# Patient Record
Sex: Male | Born: 2000 | Race: White | Hispanic: No | Marital: Married | Smoking: Never smoker
Health system: Southern US, Community
[De-identification: ages and names within clinical notes are randomized; demographics above are authoritative.]

---
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
## 2021-01-12 ENCOUNTER — Ambulatory Visit: Payer: Medicaid Other | Admitting: Nurse Practitioner

## 2021-01-12 ENCOUNTER — Ambulatory Visit (INDEPENDENT_AMBULATORY_CARE_PROVIDER_SITE_OTHER): Payer: Medicaid Other

## 2021-01-12 ENCOUNTER — Other Ambulatory Visit: Payer: Medicaid Other

## 2021-01-12 ENCOUNTER — Ambulatory Visit (INDEPENDENT_AMBULATORY_CARE_PROVIDER_SITE_OTHER): Payer: Medicaid Other | Admitting: Nurse Practitioner

## 2021-01-12 ENCOUNTER — Encounter: Payer: Self-pay | Admitting: Nurse Practitioner

## 2021-01-12 ENCOUNTER — Encounter: Payer: Self-pay | Admitting: Family Medicine

## 2021-01-12 DIAGNOSIS — R059 Cough, unspecified: Secondary | ICD-10-CM

## 2021-01-12 DIAGNOSIS — R509 Fever, unspecified: Secondary | ICD-10-CM

## 2021-01-12 LAB — VERITOR FLU A/B WAIVED
Influenza A: NEGATIVE
Influenza B: NEGATIVE

## 2021-01-12 MED ORDER — ACETAMINOPHEN 500 MG PO TABS: 500.0000 mg | ORAL_TABLET | Freq: Four times a day (QID) | ORAL | 0 refills | Status: DC | PRN

## 2021-01-12 MED ORDER — CETIRIZINE HCL 10 MG PO TABS: 10.0000 mg | ORAL_TABLET | Freq: Every day | ORAL | 1 refills | Status: DC

## 2021-01-12 MED ORDER — DM-GUAIFENESIN ER 30-600 MG PO TB12: 1.0000 | ORAL_TABLET | Freq: Two times a day (BID) | ORAL | 0 refills | Status: DC

## 2021-01-12 MED ORDER — PREDNISONE 10 MG (21) PO TBPK: ORAL_TABLET | ORAL | 0 refills | Status: DC

## 2021-01-12 NOTE — Assessment & Plan Note (Signed)
Worsening symptoms of cough and fever in the last 24 to 48 hours.  Patient is reporting fever of 101.2, cough, head congestion, pain in his chest when inhaling and exhaling.  Denies sore throat, nausea or vomiting.  Chest x-ray ordered to rule out pneumonia, guaifenesin for cough and congestion, flu swab, and COVID-19 swab.  Increase hydration, Tylenol and ibuprofen for pain and fever, prednisone taper, Zyrtec 10 mg tablet daily,  Education provided to patient.  Rx sent to pharmacy follow-up with worsening unresolved symptoms.

## 2021-01-12 NOTE — Progress Notes (Signed)
   Virtual Visit  Note Due to COVID-19 pandemic this visit was conducted virtually. This visit type was conducted due to national recommendations for restrictions regarding the COVID-19 Pandemic (e.g. social distancing, sheltering in place) in an effort to limit this patient's exposure and mitigate transmission in our community. All issues noted in this document were discussed and addressed.  A physical exam was not performed with this format.  I connected with Troy Collins on 01/12/21 at  11 AM by telephone and verified that I am speaking with the correct person using two identifiers. Troy Collins is currently located at home and mother is with patient during visit. The provider, Daryll Drown, NP is located in their office at time of visit.  I discussed the limitations, risks, security and privacy concerns of performing an evaluation and management service by telephone and the availability of in person appointments. I also discussed with the patient that there may be a patient responsible charge related to this service. The patient expressed understanding and agreed to proceed.   History and Present Illness:  HPI    Review of Systems  Constitutional: Positive for chills, fever and malaise/fatigue.  HENT: Positive for ear pain.   Respiratory: Positive for cough.   Skin: Negative for rash.  All other systems reviewed and are negative.    Observations/Objective: Televisit patient did not sound to be in distress  Assessment and Plan: Cough with fever Worsening symptoms of cough and fever in the last 24 to 48 hours.  Patient is reporting fever of 101.2, cough, head congestion, pain in his chest when inhaling and exhaling.  Denies sore throat, nausea or vomiting.  Chest x-ray ordered to rule out pneumonia, guaifenesin for cough and congestion, flu swab, and COVID-19 swab.  Increase hydration, Tylenol and ibuprofen for pain and fever, prednisone taper, Zyrtec 10 mg tablet  daily,  Education provided to patient.  Rx sent to pharmacy follow-up with worsening unresolved symptoms.   Follow Up Instructions: Follow-up for worsening unresolved symptoms.    I discussed the assessment and treatment plan with the patient. The patient was provided an opportunity to ask questions and all were answered. The patient agreed with the plan and demonstrated an understanding of the instructions.   The patient was advised to call back or seek an in-person evaluation if the symptoms worsen or if the condition fails to improve as anticipated.  The above assessment and management plan was discussed with the patient. The patient verbalized understanding of and has agreed to the management plan. Patient is aware to call the clinic if symptoms persist or worsen. Patient is aware when to return to the clinic for a follow-up visit. Patient educated on when it is appropriate to go to the emergency department.   Time call ended: 11:11 a.m.  I provided 11 minutes of  non face-to-face time during this encounter.    Daryll Drown, NP

## 2021-01-12 NOTE — Patient Instructions (Signed)
Fever, Adult     A fever is an increase in your body's temperature. It often means a temperature of 100.62F (38C) or higher. Brief mild or moderate fevers often have no long-term effects. They often do not need treatment. Moderate or high fevers may make you feel uncomfortable. Sometimes, they can be a sign of a serious illness or disease. A fever that keeps coming back or that lasts a long time may cause you to lose water in your body (get dehydrated). You can take your temperature with a thermometer to see if you have a fever. Temperature can change with:  Age.  Time of day.  Where the thermometer is put in the body. Readings may vary when the thermometer is put: ? In the mouth (oral). ? In the butt (rectal). ? In the ear (tympanic). ? Under the arm (axillary). ? On the forehead (temporal). Follow these instructions at home: Medicines  Take over-the-counter and prescription medicines only as told by your doctor. Follow the dosing instructions carefully.  If you were prescribed an antibiotic medicine, take it as told by your doctor. Do not stop taking it even if you start to feel better. General instructions  Watch for any changes in your symptoms. Tell your doctor about them.  Rest as needed.  Drink enough fluid to keep your pee (urine) pale yellow.  Sponge yourself or bathe with room-temperature water as needed. This helps to lower your body temperature. Do not use ice water.  Do not use too many blankets or wear clothes that are too heavy.  If your fever was caused by an infection that spreads from person to person (is contagious), such as a cold or the flu: ? You should stay home from work and public places for at least 24 hours after your fever is gone. ? Your fever should be gone for at least 24 hours without the need to use medicines. Contact a doctor if:  You throw up (vomit).  You cannot eat or drink without throwing up.  You have watery poop (diarrhea).  It  hurts when you pee.  Your symptoms do not get better with treatment.  You have new symptoms.  You feel very weak. Get help right away if:  You are short of breath or have trouble breathing.  You are dizzy or you pass out (faint).  You feel mixed up (confused).  You have signs of not having enough water in your body, such as: ? Dark pee, very little pee, or no pee. ? Cracked lips. ? Dry mouth. ? Sunken eyes. ? Sleepiness. ? Weakness.  You have very bad pain in your belly (abdomen).  You keep throwing up or having watery poop.  You have a rash on your skin.  Your symptoms get worse all of a sudden. Summary  A fever is an increase in your body's temperature. It often means a temperature of 100.62F (38C) or higher.  Watch for any changes in your symptoms. Tell your doctor about them.  Take all medicines only as told by your doctor.  Do not go to work or other public places if your fever was caused by an illness that can spread to other people.  Get help right away if you have signs that you do not have enough water in your body. This information is not intended to replace advice given to you by your health care provider. Make sure you discuss any questions you have with your health care provider. Document Revised: 01/07/2018  Document Reviewed: 01/07/2018 Elsevier Patient Education  2021 Elsevier Inc. Cough, Adult A cough helps to clear your throat and lungs. A cough may be a sign of an illness or another medical condition. An acute cough may only last 2-3 weeks, while a chronic cough may last 8 or more weeks. Many things can cause a cough. They include:  Germs (viruses or bacteria) that attack the airway.  Breathing in things that bother (irritate) your lungs.  Allergies.  Asthma.  Mucus that runs down the back of your throat (postnasal drip).  Smoking.  Acid backing up from the stomach into the tube that moves food from the mouth to the stomach  (gastroesophageal reflux).  Some medicines.  Lung problems.  Other medical conditions, such as heart failure or a blood clot in the lung (pulmonary embolism). Follow these instructions at home: Medicines  Take over-the-counter and prescription medicines only as told by your doctor.  Talk with your doctor before you take medicines that stop a cough (cough suppressants). Lifestyle  Do not smoke, and try not to be around smoke. Do not use any products that contain nicotine or tobacco, such as cigarettes, e-cigarettes, and chewing tobacco. If you need help quitting, ask your doctor.  Drink enough fluid to keep your pee (urine) pale yellow.  Avoid caffeine.  Do not drink alcohol if your doctor tells you not to drink.   General instructions  Watch for any changes in your cough. Tell your doctor about them.  Always cover your mouth when you cough.  Stay away from things that make you cough, such as perfume, candles, campfire smoke, or cleaning products.  If the air is dry, use a cool mist vaporizer or humidifier in your home.  If your cough is worse at night, try using extra pillows to raise your head up higher while you sleep.  Rest as needed.  Keep all follow-up visits as told by your doctor. This is important.   Contact a doctor if:  You have new symptoms.  You cough up pus.  Your cough does not get better after 2-3 weeks, or your cough gets worse.  Cough medicine does not help your cough and you are not sleeping well.  You have pain that gets worse or pain that is not helped with medicine.  You have a fever.  You are losing weight and you do not know why.  You have night sweats. Get help right away if:  You cough up blood.  You have trouble breathing.  Your heartbeat is very fast. These symptoms may be an emergency. Do not wait to see if the symptoms will go away. Get medical help right away. Call your local emergency services (911 in the [place] Do not drive  yourself to the hospital. Summary  A cough helps to clear your throat and lungs. Many things can cause a cough.  Take over-the-counter and prescription medicines only as told by your doctor.  Always cover your mouth when you cough.  Contact a doctor if you have new symptoms or you have a cough that does not get better or gets worse. This information is not intended to replace advice given to you by your health care provider. Make sure you discuss any questions you have with your health care provider. Document Revised: 09/12/2019 Document Reviewed: 08/12/2018 Elsevier Patient Education  2021 [employer].

## 2021-01-13 LAB — NOVEL CORONAVIRUS, NAA: SARS-CoV-2, NAA: NOT DETECTED

## 2021-01-13 LAB — SARS-COV-2, NAA 2 DAY TAT

## 2021-01-14 ENCOUNTER — Other Ambulatory Visit: Payer: Self-pay | Admitting: Nurse Practitioner

## 2021-01-14 MED ORDER — AZITHROMYCIN 250 MG PO TABS: ORAL_TABLET | ORAL | 0 refills | Status: AC

## 2021-01-14 NOTE — Progress Notes (Signed)
Mother aware

## 2021-01-19 ENCOUNTER — Encounter: Payer: Self-pay | Admitting: Family Medicine

## 2021-01-20 NOTE — Telephone Encounter (Signed)
Seen Je 06/08. She is off please advise.

## 2021-01-21 NOTE — Telephone Encounter (Signed)
I recommend that he eat frequent small meals that are rich in protein.  The body is healing and it may take a little time before he is back to his normal self.  Recommend recheck in 2 weeks.  If appetite is not improving at that time, we can talk about some options.

## 2021-02-04 ENCOUNTER — Other Ambulatory Visit: Payer: Self-pay

## 2021-02-04 DIAGNOSIS — R059 Cough, unspecified: Secondary | ICD-10-CM

## 2021-02-04 MED ORDER — CETIRIZINE HCL 10 MG PO TABS: 10.0000 mg | ORAL_TABLET | Freq: Every day | ORAL | 5 refills | Status: DC

## 2021-03-12 ENCOUNTER — Emergency Department (HOSPITAL_COMMUNITY)
Admission: EM | Admit: 2021-03-12 | Discharge: 2021-03-13 | Disposition: A | Discharge status: Home / Self Care | Payer: Medicaid Other | Attending: Emergency Medicine | Admitting: Emergency Medicine

## 2021-03-12 ENCOUNTER — Other Ambulatory Visit: Payer: Self-pay

## 2021-03-12 DIAGNOSIS — R109 Unspecified abdominal pain: Secondary | ICD-10-CM | POA: Insufficient documentation

## 2021-03-12 DIAGNOSIS — R519 Headache, unspecified: Secondary | ICD-10-CM | POA: Insufficient documentation

## 2021-03-12 DIAGNOSIS — Y92410 Unspecified street and highway as the place of occurrence of the external cause: Secondary | ICD-10-CM | POA: Insufficient documentation

## 2021-03-12 DIAGNOSIS — S2220XA Unspecified fracture of sternum, initial encounter for closed fracture: Secondary | ICD-10-CM | POA: Diagnosis not present

## 2021-03-12 DIAGNOSIS — M25562 Pain in left knee: Secondary | ICD-10-CM | POA: Diagnosis not present

## 2021-03-12 DIAGNOSIS — R079 Chest pain, unspecified: Secondary | ICD-10-CM | POA: Diagnosis not present

## 2021-03-12 DIAGNOSIS — S2222XA Fracture of body of sternum, initial encounter for closed fracture: Secondary | ICD-10-CM | POA: Diagnosis not present

## 2021-03-12 DIAGNOSIS — R9431 Abnormal electrocardiogram [ECG] [EKG]: Secondary | ICD-10-CM | POA: Diagnosis not present

## 2021-03-12 DIAGNOSIS — S199XXA Unspecified injury of neck, initial encounter: Secondary | ICD-10-CM | POA: Diagnosis not present

## 2021-03-12 DIAGNOSIS — S3993XA Unspecified injury of pelvis, initial encounter: Secondary | ICD-10-CM | POA: Diagnosis not present

## 2021-03-12 DIAGNOSIS — S3991XA Unspecified injury of abdomen, initial encounter: Secondary | ICD-10-CM | POA: Diagnosis not present

## 2021-03-12 DIAGNOSIS — M25521 Pain in right elbow: Secondary | ICD-10-CM | POA: Insufficient documentation

## 2021-03-12 DIAGNOSIS — S299XXA Unspecified injury of thorax, initial encounter: Secondary | ICD-10-CM | POA: Diagnosis present

## 2021-03-13 ENCOUNTER — Other Ambulatory Visit: Payer: Self-pay

## 2021-03-13 ENCOUNTER — Encounter (HOSPITAL_COMMUNITY): Payer: Self-pay

## 2021-03-13 ENCOUNTER — Emergency Department (HOSPITAL_COMMUNITY): Payer: Medicaid Other

## 2021-03-13 ENCOUNTER — Encounter: Payer: Self-pay | Admitting: Family Medicine

## 2021-03-13 ENCOUNTER — Telehealth (HOSPITAL_COMMUNITY): Payer: Self-pay | Admitting: Emergency Medicine

## 2021-03-13 DIAGNOSIS — M25521 Pain in right elbow: Secondary | ICD-10-CM | POA: Diagnosis not present

## 2021-03-13 DIAGNOSIS — R519 Headache, unspecified: Secondary | ICD-10-CM | POA: Diagnosis not present

## 2021-03-13 DIAGNOSIS — S3991XA Unspecified injury of abdomen, initial encounter: Secondary | ICD-10-CM | POA: Diagnosis not present

## 2021-03-13 DIAGNOSIS — S3993XA Unspecified injury of pelvis, initial encounter: Secondary | ICD-10-CM | POA: Diagnosis not present

## 2021-03-13 DIAGNOSIS — R079 Chest pain, unspecified: Secondary | ICD-10-CM | POA: Diagnosis not present

## 2021-03-13 DIAGNOSIS — M25562 Pain in left knee: Secondary | ICD-10-CM | POA: Diagnosis not present

## 2021-03-13 DIAGNOSIS — S199XXA Unspecified injury of neck, initial encounter: Secondary | ICD-10-CM | POA: Diagnosis not present

## 2021-03-13 LAB — CBC WITH DIFFERENTIAL/PLATELET
Abs Immature Granulocytes: 0.09 10*3/uL — ABNORMAL HIGH (ref 0.00–0.07)
Basophils Absolute: 0.1 10*3/uL (ref 0.0–0.1)
Basophils Relative: 1 %
Eosinophils Absolute: 0.1 10*3/uL (ref 0.0–0.5)
Eosinophils Relative: 1 %
HCT: 42.7 % (ref 39.0–52.0)
Hemoglobin: 14 g/dL (ref 13.0–17.0)
Immature Granulocytes: 1 %
Lymphocytes Relative: 13 %
Lymphs Abs: 1.7 10*3/uL (ref 0.7–4.0)
MCH: 29.9 pg (ref 26.0–34.0)
MCHC: 32.8 g/dL (ref 30.0–36.0)
MCV: 91.2 fL (ref 80.0–100.0)
Monocytes Absolute: 1.1 10*3/uL — ABNORMAL HIGH (ref 0.1–1.0)
Monocytes Relative: 9 %
Neutro Abs: 10.1 10*3/uL — ABNORMAL HIGH (ref 1.7–7.7)
Neutrophils Relative %: 75 %
Platelets: 296 10*3/uL (ref 150–400)
RBC: 4.68 MIL/uL (ref 4.22–5.81)
RDW: 13.2 % (ref 11.5–15.5)
WBC: 13.1 10*3/uL — ABNORMAL HIGH (ref 4.0–10.5)
nRBC: 0 % (ref 0.0–0.2)

## 2021-03-13 LAB — BASIC METABOLIC PANEL
Anion gap: 7 (ref 5–15)
BUN: 8 mg/dL (ref 6–20)
CO2: 26 mmol/L (ref 22–32)
Calcium: 8.7 mg/dL — ABNORMAL LOW (ref 8.9–10.3)
Chloride: 105 mmol/L (ref 98–111)
Creatinine, Ser: 0.81 mg/dL (ref 0.61–1.24)
GFR, Estimated: >60 mL/min (ref >60)
Glucose, Bld: 103 mg/dL — ABNORMAL HIGH (ref 70–99)
Potassium: 3.4 mmol/L — ABNORMAL LOW (ref 3.5–5.1)
Sodium: 138 mmol/L (ref 135–145)

## 2021-03-13 LAB — TROPONIN I (HIGH SENSITIVITY): Troponin I (High Sensitivity): 4 ng/L (ref <18)

## 2021-03-13 MED ORDER — IOHEXOL 300 MG/ML  SOLN
100.0000 mL | Freq: Once | INTRAMUSCULAR | Status: AC | PRN
  Administered 2021-03-13: 100 mL via INTRAVENOUS

## 2021-03-13 MED ORDER — IBUPROFEN 400 MG PO TABS
400.0000 mg | ORAL_TABLET | Freq: Once | ORAL | Status: AC
  Administered 2021-03-13: 400 mg via ORAL

## 2021-03-13 MED ORDER — HYDROCODONE-ACETAMINOPHEN 5-325 MG PO TABS: 2.0000 | ORAL_TABLET | ORAL | 0 refills | Status: DC | PRN

## 2021-03-13 MED ORDER — HYDROCODONE-ACETAMINOPHEN 5-325 MG PO TABS: 1.0000 | ORAL_TABLET | Freq: Four times a day (QID) | ORAL | 0 refills | Status: DC | PRN

## 2021-03-13 MED ORDER — HYDROCODONE-ACETAMINOPHEN 5-325 MG PO TABS
1.0000 | ORAL_TABLET | Freq: Once | ORAL | Status: AC
  Administered 2021-03-13: 1 via ORAL
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
## 2021-03-13 NOTE — Discharge Instructions (Addendum)
Take the pain medication as prescribed.  Avoid lifting for least 1 week.  Follow-up with your doctor for recheck next week.  Return to the ED with worsening pain, difficulty breathing, any other concerns.

## 2021-03-13 NOTE — Telephone Encounter (Signed)
Per discussion with pharmacy the patient's dosing of hydrocodone was too much, I have sent a different prescription with a decreased frequency as this patient is opiate nave, 1 tablet every 6 hours of Norco 5/325 as needed for pain
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
## 2021-03-14 ENCOUNTER — Telehealth: Payer: Self-pay

## 2021-03-14 NOTE — Telephone Encounter (Signed)
Transition Care Management Unsuccessful Follow-up Telephone Call  Date of discharge and from where:  03/13/2021-[facility]   Attempts:  1st Attempt  Reason for unsuccessful TCM follow-up call:  Unable to reach patient

## 2021-03-15 ENCOUNTER — Ambulatory Visit (INDEPENDENT_AMBULATORY_CARE_PROVIDER_SITE_OTHER): Payer: Medicaid Other | Admitting: Family Medicine

## 2021-03-15 ENCOUNTER — Encounter: Payer: Self-pay | Admitting: Family Medicine

## 2021-03-15 ENCOUNTER — Other Ambulatory Visit: Payer: Self-pay

## 2021-03-15 ENCOUNTER — Ambulatory Visit (INDEPENDENT_AMBULATORY_CARE_PROVIDER_SITE_OTHER): Payer: Medicaid Other

## 2021-03-15 VITALS — BP 107/62 | HR 63 | Temp 97.9°F | Ht 74.0 in | Wt 190.6 lb

## 2021-03-15 DIAGNOSIS — S2220XD Unspecified fracture of sternum, subsequent encounter for fracture with routine healing: Secondary | ICD-10-CM | POA: Diagnosis not present

## 2021-03-15 DIAGNOSIS — S2222XA Fracture of body of sternum, initial encounter for closed fracture: Secondary | ICD-10-CM

## 2021-03-15 DIAGNOSIS — S2222XD Fracture of body of sternum, subsequent encounter for fracture with routine healing: Secondary | ICD-10-CM | POA: Diagnosis not present

## 2021-03-15 MED ORDER — METHOCARBAMOL 500 MG PO TABS: 500.0000 mg | ORAL_TABLET | Freq: Three times a day (TID) | ORAL | 0 refills | Status: DC | PRN

## 2021-03-15 MED ORDER — MELOXICAM 15 MG PO TABS: 15.0000 mg | ORAL_TABLET | Freq: Every day | ORAL | 0 refills | Status: DC | PRN
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
## 2021-03-15 NOTE — Telephone Encounter (Signed)
Transition Care Management Unsuccessful Follow-up Telephone Call  Date of discharge and from where:  03/13/2021-[facility] ED  Attempts:  2nd Attempt  Reason for unsuccessful TCM follow-up call:  Unable to reach patient

## 2021-03-16 NOTE — Telephone Encounter (Signed)
Transition Care Management Unsuccessful Follow-up Telephone Call  Date of discharge and from where:  03/13/2021 [facility]  Attempts:  3rd Attempt  Reason for unsuccessful TCM follow-up call:  Unable to reach patient

## 2021-03-24 ENCOUNTER — Encounter: Payer: Self-pay | Admitting: Family Medicine

## 2021-03-25 ENCOUNTER — Ambulatory Visit (INDEPENDENT_AMBULATORY_CARE_PROVIDER_SITE_OTHER): Payer: Medicaid Other | Admitting: Family Medicine

## 2021-03-25 ENCOUNTER — Other Ambulatory Visit: Payer: Self-pay

## 2021-03-25 ENCOUNTER — Encounter: Payer: Self-pay | Admitting: Family Medicine

## 2021-03-25 DIAGNOSIS — S2222XD Fracture of body of sternum, subsequent encounter for fracture with routine healing: Secondary | ICD-10-CM | POA: Diagnosis not present

## 2021-03-25 MED ORDER — LIDOCAINE 5 % EX OINT: 1.0000 "application " | TOPICAL_OINTMENT | CUTANEOUS | 0 refills | Status: DC | PRN

## 2021-03-25 MED ORDER — METHOCARBAMOL 500 MG PO TABS: 500.0000 mg | ORAL_TABLET | Freq: Three times a day (TID) | ORAL | 1 refills | Status: DC | PRN
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
## 2021-03-25 NOTE — Patient Instructions (Signed)
Sternal Fracture  A sternal fracture is a break in the bone in the center of the chest (sternum or breastbone). This type of fracture often causes pain that can get worse when you breathe deeply or cough. A sternal fracture is not dangerous unless there is also aninjury to your heart or lungs, which are protected by the sternum and ribs. What are the causes? This condition is usually caused by a forceful injury from: Motor vehicle accidents. This is the most common cause. Contact sports. Physical assaults. Falls. You can also develop a sternal fracture without having a forceful injury if the bone becomes weakened over time (stress fracture or insufficiency fracture). What increases the risk? You are more likely to have a sternal fracture if you: Participate in contact sports, such as football, lacrosse, wrestling, or martial arts. Work at elevated heights, such as in Holiday representative. This increases your risk of a fall. The following factors may make you more likely to develop a stress fracture or insufficiency fracture: Being male. Being a postmenopausal woman. Being 50 years of age or older. Having weak bones (osteoporosis). Having severe curvature of the spine. Being on long-term steroid treatment. What are the signs or symptoms? Symptoms of this condition include: Pain over the sternum or chest wall. Tenderness of the sternum or chest wall. Pain that gets worse when you breathe deeply or cough. Shortness of breath. A bruise (contusion) over the chest. Swelling. A crackling sound when taking a deep breath or pressing on the sternum. How is this diagnosed? This condition is diagnosed based on: A physical exam. Your medical history. Tests, such as: Blood oxygen level. This is measured with a pulse oximetry test. Repeated electrocardiograms (ECGs). This is to make sure that your heart is not injured. A blood test. This is to check for damage to your heart muscle. Imaging tests,  such as: A CT scan. An ultrasound. Chest X-rays. How is this treated? Treatment depends on the severity of your injury. A sternal fracture without any other injury (isolated sternal fracture) usually heals without treatment. You may need to: Limit some activities at home. Take medicines for pain relief. Do deep breathing exercises to prevent injury and infection to your lungs. In rare cases, surgery may be needed if a sternal fracture: Continues to cause severe pain. Causes shortness of breath or respiratory problems. Involves bones that have been moved too far out of position (displaced fracture). Follow these instructions at home: Managing pain, stiffness, and swelling  If directed, put ice on the injured area. Put ice in a plastic bag. Place a towel between your skin and the bag. Leave the ice on for 20 minutes, 2-3 times a day.  Medicines Take over-the-counter and prescription medicines only as told by your health care provider. Ask your health care provider if the medicine prescribed to you: Requires you to avoid driving or using heavy machinery. Can cause constipation. You may need to take actions to prevent or treat constipation, such as: Drink enough fluid to keep your urine pale yellow. Take over-the-counter or prescription medicines. Eat foods that are high in fiber, such as beans, whole grains, and fresh fruits and vegetables. Limit foods that are high in fat and processed sugars, such as fried or sweet foods. Activity Rest at home. Return to your normal activities as told by your health care provider. Ask your health care provider what activities are safe for you. Do breathing exercises as told by your health care provider. Do not push or pull  with your arms when getting in and out of bed. Do not lift anything that is heavier than 10 lb (4.5 kg), or the limit that you are told, until your health care provider says that it is safe. General instructions Hug a pillow  when you sneeze, cough, or twist or bend at the waist. Doing this helps support your chest. Do not use any products that contain nicotine or tobacco, such as cigarettes, e-cigarettes, and chewing tobacco. These can delay bone healing. If you need help quitting, ask your health care provider. Keep all follow-up visits as told by your health care provider. This is important. Contact a health care provider if: Your pain medicine is not helping. You continue to have pain after several weeks. You have swelling or bruising that gets worse. You develop a fever or chills. You develop a cough and you cough up thick or bloody mucus from your lungs (sputum). Get help right away if you: Have difficulty breathing. Have chest pain. Feel light-headed. Have fast or irregular heartbeats (palpitations). Feel nauseous or have pain in your abdomen. Summary A sternal fracture is a break in the bone in the center of the chest (sternum or breastbone). This condition is usually caused by a forceful injury. The most common cause is motor vehicle accidents. If directed, put ice on the injured area. Return to your normal activities as told by your health care provider. Ask your health care provider what activities are safe for you. This information is not intended to replace advice given to you by your health care provider. Make sure you discuss any questions you have with your healthcare provider. Document Revised: 07/25/2018 Document Reviewed: 07/25/2018 Elsevier Patient Education  2022 [employer].

## 2021-03-25 NOTE — Telephone Encounter (Signed)
Please offer him a same day appt with whoever is available for a recheck.

## 2021-04-07 ENCOUNTER — Other Ambulatory Visit: Payer: Self-pay | Admitting: Family

## 2021-04-07 ENCOUNTER — Encounter: Payer: Self-pay | Admitting: Family Medicine

## 2021-04-07 DIAGNOSIS — S2222XD Fracture of body of sternum, subsequent encounter for fracture with routine healing: Secondary | ICD-10-CM

## 2021-04-07 MED ORDER — METHOCARBAMOL 500 MG PO TABS: 500.0000 mg | ORAL_TABLET | Freq: Three times a day (TID) | ORAL | 1 refills | Status: DC | PRN

## 2021-04-10 ENCOUNTER — Other Ambulatory Visit: Payer: Self-pay | Admitting: Family Medicine

## 2021-04-10 DIAGNOSIS — S2222XD Fracture of body of sternum, subsequent encounter for fracture with routine healing: Secondary | ICD-10-CM

## 2021-04-12 ENCOUNTER — Encounter: Payer: Self-pay | Admitting: Family Medicine

## 2021-04-12 NOTE — Telephone Encounter (Signed)
Yes ok to provide.

## 2021-04-18 ENCOUNTER — Telehealth: Payer: Self-pay | Admitting: Family Medicine

## 2021-05-15 ENCOUNTER — Other Ambulatory Visit: Payer: Self-pay | Admitting: Family Medicine

## 2021-05-15 DIAGNOSIS — S2222XD Fracture of body of sternum, subsequent encounter for fracture with routine healing: Secondary | ICD-10-CM

## 2021-10-14 ENCOUNTER — Encounter: Payer: Self-pay | Admitting: Family Medicine

## 2021-10-14 ENCOUNTER — Ambulatory Visit (INDEPENDENT_AMBULATORY_CARE_PROVIDER_SITE_OTHER): Payer: Medicaid Other

## 2021-10-14 ENCOUNTER — Ambulatory Visit (INDEPENDENT_AMBULATORY_CARE_PROVIDER_SITE_OTHER): Payer: Medicaid Other | Admitting: Family Medicine

## 2021-10-14 VITALS — BP 135/78 | HR 94 | Temp 98.3°F | Ht 74.07 in | Wt 175.8 lb

## 2021-10-14 DIAGNOSIS — R0781 Pleurodynia: Secondary | ICD-10-CM | POA: Diagnosis not present

## 2021-10-14 DIAGNOSIS — R0789 Other chest pain: Secondary | ICD-10-CM

## 2021-10-14 MED ORDER — DICLOFENAC SODIUM 75 MG PO TBEC
75.0000 mg | DELAYED_RELEASE_TABLET | Freq: Two times a day (BID) | ORAL | 1 refills | Status: DC
Start: 2021-10-14 — End: 2021-10-19
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
## 2021-10-17 ENCOUNTER — Telehealth: Payer: Self-pay

## 2021-10-17 ENCOUNTER — Other Ambulatory Visit: Payer: Self-pay | Admitting: Family Medicine

## 2021-10-17 DIAGNOSIS — R0789 Other chest pain: Secondary | ICD-10-CM

## 2021-10-17 DIAGNOSIS — R0781 Pleurodynia: Secondary | ICD-10-CM

## 2021-10-17 DIAGNOSIS — R9389 Abnormal findings on diagnostic imaging of other specified body structures: Secondary | ICD-10-CM

## 2021-10-17 NOTE — Telephone Encounter (Signed)
Diclofenac Sodium 75MG  dr tablets ? ?Key: - PA Case ID: [id number] - Rx #: [id number] ? ?Sent to plan ?

## 2021-10-18 ENCOUNTER — Encounter: Payer: Self-pay | Admitting: Family Medicine

## 2021-10-19 MED ORDER — NAPROXEN 500 MG PO TABS: 500.0000 mg | ORAL_TABLET | Freq: Two times a day (BID) | ORAL | 1 refills | Status: DC

## 2021-10-19 NOTE — Telephone Encounter (Signed)
Deniedon March 13 ?PA Case: [id number], Status: Denied. Notification: Completed. ? ? ?Will have to try and fail 2 of the following:  ? ?Health Plan?s Preferred Products ?NAPROXEN TAB 500MG  ?IBUPROFEN TAB 600MG  [id number] ?MELOXICAM TAB 7.5MG  ?EC-NAPROXEN TAB 500MG  [id number] ?NAPROXEN DR TAB 500MG  [id number] ? ?

## 2021-10-19 NOTE — Telephone Encounter (Signed)
Mom aware per dpr.  ?

## 2021-10-19 NOTE — Telephone Encounter (Signed)
Changed to naproxen

## 2021-10-19 NOTE — Addendum Note (Signed)
Addended by: Deliah Boston F on: 10/19/2021 02:02 PM ? ? Modules accepted: Orders ? ?

## 2021-10-19 NOTE — Telephone Encounter (Signed)
Will cc to ordering provider.  I suspect he can use any but maybe chose enteric coating for a reason? ?

## 2021-11-03 ENCOUNTER — Ambulatory Visit: Payer: Medicaid Other | Admitting: Family Medicine

## 2021-11-14 ENCOUNTER — Other Ambulatory Visit (INDEPENDENT_AMBULATORY_CARE_PROVIDER_SITE_OTHER): Payer: Medicaid Other

## 2021-11-14 DIAGNOSIS — R9389 Abnormal findings on diagnostic imaging of other specified body structures: Secondary | ICD-10-CM

## 2021-11-14 DIAGNOSIS — R0789 Other chest pain: Secondary | ICD-10-CM

## 2021-11-14 DIAGNOSIS — R0781 Pleurodynia: Secondary | ICD-10-CM

## 2022-02-23 ENCOUNTER — Ambulatory Visit: Payer: Medicaid Other | Admitting: Family Medicine

## 2022-02-23 ENCOUNTER — Encounter: Payer: Self-pay | Admitting: Family Medicine

## 2022-02-23 VITALS — BP 134/77 | HR 79 | Temp 98.8°F | Ht 74.0 in | Wt 166.0 lb

## 2022-02-23 DIAGNOSIS — K219 Gastro-esophageal reflux disease without esophagitis: Secondary | ICD-10-CM

## 2022-02-23 MED ORDER — OMEPRAZOLE 20 MG PO CPDR: 20.0000 mg | DELAYED_RELEASE_CAPSULE | Freq: Every day | ORAL | 3 refills | Status: DC
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
## 2022-02-27 ENCOUNTER — Ambulatory Visit: Payer: Medicaid Other | Admitting: Nurse Practitioner

## 2022-03-21 ENCOUNTER — Encounter: Payer: Self-pay | Admitting: Nurse Practitioner

## 2022-03-21 ENCOUNTER — Ambulatory Visit (INDEPENDENT_AMBULATORY_CARE_PROVIDER_SITE_OTHER): Payer: Medicaid Other | Admitting: Nurse Practitioner

## 2022-03-21 VITALS — BP 107/73 | HR 87 | Temp 98.6°F | Ht 73.0 in | Wt 162.0 lb

## 2022-03-21 DIAGNOSIS — N62 Hypertrophy of breast: Secondary | ICD-10-CM | POA: Diagnosis not present

## 2022-03-21 MED ORDER — IBUPROFEN 600 MG PO TABS: 600.0000 mg | ORAL_TABLET | Freq: Three times a day (TID) | ORAL | 0 refills | Status: DC | PRN

## 2022-03-21 NOTE — Patient Instructions (Signed)
Gynecomastia, Adult Gynecomastia is an overgrowth of gland tissue in a man's breasts. This may cause one or both breasts to become enlarged. The condition often develops in men who have an imbalance of the male sex hormone (testosterone) and the male sex hormone (estrogen). This means that a man may have too much estrogen, too little testosterone, or both. Gynecomastia may be a normal part of aging for some men. It can also happen to adolescent boys during puberty. What are the causes? This condition may be caused by: Certain medicines, such as: Estrogen supplements and medicines that act like estrogen in the body. Medicines that keep testosterone from functioning normally in the body (testosterone-inhibiting drugs). Anabolic steroids. Medicines to treat heartburn, cancer, heart disease, mental health problems, HIV, or AIDS. Antibiotic medicine. Chemotherapy medicine. Recreational drugs, including alcohol, marijuana, and opioids. Herbal products, including lavender and tea tree oil. A gene that is passed from parent to child (inherited). Certain medical conditions, such as: Tumors in the pituitary or adrenal gland. An overactive thyroid gland. Certain inherited disorders, including a genetic disease that causes low testosterone in males (Klinefelter syndrome). Cancer of the lung, kidney, liver, testicle, or gastrointestinal tract. Conditions that cause liver or kidney failure. Poor nutrition and starvation. Testicle shrinking or failure (testicularatrophy). In some cases, the cause may not be known. What increases the risk? The following factors may make you more likely to develop this condition: Being 50 years old or older. Being overweight. Abusing alcohol or other drugs. Having a family history of gynecomastia. What are the signs or symptoms? In most cases, breast enlargement is the only symptom. The enlargement may start near the nipple, and the breast tissue may feel firm and  rubbery. Other symptoms may include: Pain or tenderness in the breasts. Itchy breasts. How is this diagnosed? This condition may be diagnosed based on: Your symptoms and medical history. A physical exam. Imaging tests, such as: An ultrasound. A mammogram. An MRI. Blood tests. Removal of a sample of breast tissue to be tested in a lab (biopsy). How is this treated? This condition may go away on its own, without treatment. If gynecomastia is caused by a medical problem or drug abuse, treatment may include: Getting treatment for the underlying medical problem or for drug abuse. Changing or stopping medicines. Medicines to block the effects of estrogen. Taking a testosterone replacement. Surgery to remove breast tissue or any lumps in your breasts. Breast reduction surgery. This may be an option if you have severe or painful gynecomastia. Follow these instructions at home:  Take over-the-counter and prescription medicines only as told by your health care provider. Talk to your health care provider before taking any herbal medicines or diet supplements. Do not abuse drugs or alcohol. Keep all follow-up visits as told by your health care provider. This is important. Contact a health care provider if: Your breast tissue grows larger or gets more swollen or painful. You have a lump in your testicle. You have blood or discharge coming from your nipples. Your nipple changes shape. You develop a hard or painful lump in your breast. Summary Gynecomastia is an overgrowth of gland tissue in a man's breasts. This may cause one or both breasts to become enlarged. In most cases, breast enlargement is the only symptom. The enlargement may start near the nipple, and the breast tissue may feel firm and rubbery. This condition may go away on its own, without treatment. In some cases, treatment for an underlying medical problem or for drug   abuse may be needed. Take over-the-counter and prescription  medicines only as told by your health care provider. Do not abuse drugs or alcohol. This information is not intended to replace advice given to you by your health care provider. Make sure you discuss any questions you have with your health care provider. Document Revised: 01/16/2019 Document Reviewed: 01/16/2019 Elsevier Patient Education  2023 Elsevier Inc.  

## 2022-03-21 NOTE — Progress Notes (Signed)
Acute Office Visit  Subjective:     Patient ID: Troy Collins, male    DOB: 12-26-2000, 21 y.o.   MRN: [id number]  Chief Complaint  Patient presents with   Mass    Lump on chest - 2 days ago - staying about the same size - feels a sharp pain off and on     HPI Patient is in today for Pain  He reports new onset left breat pain. was not an injury that may have caused the pain. The pain started a few weeks ago and is worsening. The pain does radiate to mid chest. The pain is described as aching, sharp, and soreness, is 5/10 in intensity, occurring constantly. Symptoms are worse in the: morning, mid-day, afternoon, evening  Aggravating factors: none Relieving factors: none.  He has tried acetaminophen with no relief.    Review of Systems  Constitutional: Negative.  Negative for fever and malaise/fatigue.  HENT: Negative.    Eyes: Negative.   Respiratory: Negative.    Cardiovascular: Negative.   Genitourinary: Negative.   Musculoskeletal:        Left breast pain  Skin: Negative.  Negative for itching and rash.  Neurological: Negative.   All other systems reviewed and are negative.       Objective:    BP 107/73   Pulse 87   Temp 98.6 F (37 C)   Ht 6\' 1"  (1.854 m)   Wt 162 lb (73.5 kg)   SpO2 98%   BMI 21.37 kg/m  BP Readings from Last 3 Encounters:  03/21/22 107/73  02/23/22 134/77  10/14/21 135/78   Wt Readings from Last 3 Encounters:  03/21/22 162 lb (73.5 kg) (60 %, Z= 0.27)*  02/23/22 166 lb (75.3 kg) (66 %, Z= 0.42)*  10/14/21 175 lb 12.8 oz (79.7 kg) (78 %, Z= 0.79)*   * Growth percentiles are based on CDC (Boys, 2-20 Years) data.      Physical Exam Vitals and nursing note reviewed.  Constitutional:      Appearance: Normal appearance.  HENT:     Head: Normocephalic.     Right Ear: External ear normal.     Left Ear: External ear normal.     Nose: Nose normal.     Mouth/Throat:     Mouth: Mucous membranes are moist.     Pharynx: Oropharynx  is clear.  Eyes:     Conjunctiva/sclera: Conjunctivae normal.  Cardiovascular:     Rate and Rhythm: Normal rate and regular rhythm.     Pulses: Normal pulses.     Heart sounds: Normal heart sounds.  Pulmonary:     Effort: Pulmonary effort is normal.     Breath sounds: Normal breath sounds.  Chest:       Comments: Left chest pain no lump palpated Abdominal:     General: Bowel sounds are normal.  Musculoskeletal:        General: Normal range of motion.  Skin:    General: Skin is warm.  Neurological:     General: No focal deficit present.     Mental Status: He is alert and oriented to person, place, and time.  Psychiatric:        Mood and Affect: Mood normal.        Behavior: Behavior normal.     No results found for any visits on 03/21/22.      Assessment & Plan:  Left breast pain symptoms not well controlled in the past few weeks.  Completed bilateral full breast exam.  Differential diagnosis: Fibroadenoma, cyst and gynecomastia.  Patient's symptoms present more like gynecomastia.  Patient does admit to using marijuana which could be sometimes a culprit for breast pain and tenderness in men.  Patient also reports that symptoms have been present for few years but has gotten worse over time. Recommendation-ice compress/warm compress as tolerated.  Ibuprofen 600 mg tablet by mouth as needed for pain.  Ultrasound referral completed results pending.  Follow-up for worsening unresolved symptoms. Problem List Items Addressed This Visit   None Visit Diagnoses     Gynecomastia, male    -  Primary   Relevant Medications   ibuprofen (ADVIL) 600 MG tablet   Other Relevant Orders   US BREAST COMPLETE UNI LEFT INC AXILLA       Meds ordered this encounter  Medications   ibuprofen (ADVIL) 600 MG tablet    Sig: Take 1 tablet (600 mg total) by mouth every 8 (eight) hours as needed.    Dispense:  30 tablet    Refill:  0    Order Specific Question:   Supervising Provider     Answer:   Mechele Claude [phone]    Return if symptoms worsen or fail to improve.  Daryll Drown, NP

## 2022-03-22 ENCOUNTER — Other Ambulatory Visit: Payer: Self-pay

## 2022-03-22 DIAGNOSIS — N6310 Unspecified lump in the right breast, unspecified quadrant: Secondary | ICD-10-CM

## 2022-03-22 DIAGNOSIS — N62 Hypertrophy of breast: Secondary | ICD-10-CM

## 2022-03-27 ENCOUNTER — Telehealth: Payer: Self-pay | Admitting: Family Medicine

## 2022-03-28 NOTE — Telephone Encounter (Signed)
Troy Collins spoke to patient and contact information was given for [facility]. Checked this morning patient is scheduled for 04/12/22

## 2022-04-12 ENCOUNTER — Ambulatory Visit (HOSPITAL_COMMUNITY)
Admission: RE | Admit: 2022-04-12 | Discharge: 2022-04-12 | Disposition: A | Discharge status: Home / Self Care | Payer: Medicaid Other | Source: Ambulatory Visit | Attending: Nurse Practitioner | Admitting: Nurse Practitioner

## 2022-04-12 ENCOUNTER — Encounter (HOSPITAL_COMMUNITY): Payer: Self-pay

## 2022-04-12 DIAGNOSIS — N6310 Unspecified lump in the right breast, unspecified quadrant: Secondary | ICD-10-CM | POA: Diagnosis not present

## 2022-04-12 DIAGNOSIS — N62 Hypertrophy of breast: Secondary | ICD-10-CM | POA: Insufficient documentation

## 2022-04-12 DIAGNOSIS — N644 Mastodynia: Secondary | ICD-10-CM | POA: Diagnosis not present

## 2022-04-18 ENCOUNTER — Ambulatory Visit (INDEPENDENT_AMBULATORY_CARE_PROVIDER_SITE_OTHER): Payer: Medicaid Other | Admitting: Family Medicine

## 2022-04-18 ENCOUNTER — Encounter: Payer: Self-pay | Admitting: Family Medicine

## 2022-04-18 VITALS — BP 124/80 | HR 77 | Temp 98.0°F | Ht 73.01 in | Wt 161.0 lb

## 2022-04-18 DIAGNOSIS — G479 Sleep disorder, unspecified: Secondary | ICD-10-CM | POA: Diagnosis not present

## 2022-04-18 DIAGNOSIS — F419 Anxiety disorder, unspecified: Secondary | ICD-10-CM

## 2022-04-18 DIAGNOSIS — N62 Hypertrophy of breast: Secondary | ICD-10-CM

## 2022-04-18 MED ORDER — SERTRALINE HCL 50 MG PO TABS: 50.0000 mg | ORAL_TABLET | Freq: Every day | ORAL | 1 refills | Status: DC

## 2022-04-18 MED ORDER — HYDROXYZINE PAMOATE 25 MG PO CAPS: 25.0000 mg | ORAL_CAPSULE | Freq: Every evening | ORAL | 1 refills | Status: DC | PRN
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
## 2022-04-18 NOTE — Patient Instructions (Signed)
Gynecomastia, Adult Gynecomastia is an overgrowth of gland tissue in a man's breasts. This may cause one or both breasts to become enlarged. The condition often develops in men who have an imbalance of the male sex hormone (testosterone) and the male sex hormone (estrogen). This means that a man may have too much estrogen, too little testosterone, or both. Gynecomastia may be a normal part of aging for some men. It can also happen to adolescent boys during puberty. What are the causes? This condition may be caused by: Certain medicines, such as: Estrogen supplements and medicines that act like estrogen in the body. Medicines that keep testosterone from functioning normally in the body (testosterone-inhibiting drugs). Anabolic steroids. Medicines to treat heartburn, cancer, heart disease, mental health problems, HIV, or AIDS. Antibiotic medicine. Chemotherapy medicine. Recreational drugs, including alcohol, marijuana, and opioids. Herbal products, including lavender and tea tree oil. A gene that is passed from parent to child (inherited). Certain medical conditions, such as: Tumors in the pituitary or adrenal gland. An overactive thyroid gland. Certain inherited disorders, including a genetic disease that causes low testosterone in males (Klinefelter syndrome). Cancer of the lung, kidney, liver, testicle, or gastrointestinal tract. Conditions that cause liver or kidney failure. Poor nutrition and starvation. Testicle shrinking or failure (testicularatrophy). In some cases, the cause may not be known. What increases the risk? The following factors may make you more likely to develop this condition: Being 50 years old or older. Being overweight. Abusing alcohol or other drugs. Having a family history of gynecomastia. What are the signs or symptoms? In most cases, breast enlargement is the only symptom. The enlargement may start near the nipple, and the breast tissue may feel firm and  rubbery. Other symptoms may include: Pain or tenderness in the breasts. Itchy breasts. How is this diagnosed? This condition may be diagnosed based on: Your symptoms and medical history. A physical exam. Imaging tests, such as: An ultrasound. A mammogram. An MRI. Blood tests. Removal of a sample of breast tissue to be tested in a lab (biopsy). How is this treated? This condition may go away on its own, without treatment. If gynecomastia is caused by a medical problem or drug abuse, treatment may include: Getting treatment for the underlying medical problem or for drug abuse. Changing or stopping medicines. Medicines to block the effects of estrogen. Taking a testosterone replacement. Surgery to remove breast tissue or any lumps in your breasts. Breast reduction surgery. This may be an option if you have severe or painful gynecomastia. Follow these instructions at home:  Take over-the-counter and prescription medicines only as told by your health care provider. Talk to your health care provider before taking any herbal medicines or diet supplements. Do not abuse drugs or alcohol. Keep all follow-up visits as told by your health care provider. This is important. Contact a health care provider if: Your breast tissue grows larger or gets more swollen or painful. You have a lump in your testicle. You have blood or discharge coming from your nipples. Your nipple changes shape. You develop a hard or painful lump in your breast. Summary Gynecomastia is an overgrowth of gland tissue in a man's breasts. This may cause one or both breasts to become enlarged. In most cases, breast enlargement is the only symptom. The enlargement may start near the nipple, and the breast tissue may feel firm and rubbery. This condition may go away on its own, without treatment. In some cases, treatment for an underlying medical problem or for drug   abuse may be needed. Take over-the-counter and prescription  medicines only as told by your health care provider. Do not abuse drugs or alcohol. This information is not intended to replace advice given to you by your health care provider. Make sure you discuss any questions you have with your health care provider. Document Revised: 01/16/2019 Document Reviewed: 01/16/2019 Elsevier Patient Education  2023 Elsevier Inc.  

## 2022-05-22 ENCOUNTER — Other Ambulatory Visit: Payer: Self-pay | Admitting: Family Medicine

## 2022-05-22 DIAGNOSIS — F419 Anxiety disorder, unspecified: Secondary | ICD-10-CM

## 2022-05-22 DIAGNOSIS — G479 Sleep disorder, unspecified: Secondary | ICD-10-CM

## 2022-05-26 ENCOUNTER — Ambulatory Visit: Payer: Medicaid Other | Admitting: Family Medicine

## 2022-11-30 ENCOUNTER — Encounter: Payer: Self-pay | Admitting: Family Medicine

## 2022-11-30 ENCOUNTER — Ambulatory Visit: Payer: Medicaid Other | Admitting: Family Medicine

## 2022-11-30 VITALS — BP 127/67 | HR 77 | Temp 98.4°F | Ht 73.0 in | Wt 191.4 lb

## 2022-11-30 DIAGNOSIS — R519 Headache, unspecified: Secondary | ICD-10-CM

## 2022-11-30 DIAGNOSIS — J302 Other seasonal allergic rhinitis: Secondary | ICD-10-CM | POA: Diagnosis not present

## 2022-11-30 DIAGNOSIS — G245 Blepharospasm: Secondary | ICD-10-CM | POA: Diagnosis not present

## 2022-11-30 DIAGNOSIS — F419 Anxiety disorder, unspecified: Secondary | ICD-10-CM | POA: Diagnosis not present

## 2022-11-30 MED ORDER — SERTRALINE HCL 50 MG PO TABS: 50.0000 mg | ORAL_TABLET | Freq: Every day | ORAL | 1 refills | Status: DC

## 2022-11-30 MED ORDER — NAPROXEN 250 MG PO TABS: 500.0000 mg | ORAL_TABLET | Freq: Two times a day (BID) | ORAL | 1 refills | Status: DC

## 2022-11-30 NOTE — Progress Notes (Signed)
Acute Office Visit  Subjective:     Patient ID: Troy Collins, male    DOB: [birth year], 22 y.o.   MRN: [id number]  Chief Complaint  Patient presents with   Headache    HPI Patient is in today for HA. It has been intermittent. It is left sided. It is a dull pain that last for a few seconds at most. This has occurred 4-5x a day for the last 10 days. He denies changes in vision, dizziness, weakness, changes in speech, numbness, tingling, nausea, vomiting, fever, or neck pain. He has had intermittently seasonal allergy symptoms with runny nose, nasal congestion, dry cough. He has not tried any remedies. He has been having intermittent twitching of his eyelid. No pain or drainage. He has not been sleeping well lately.   He has not been taking zoloft for anxiety and depression. He reports that symptoms are not well controlled and he would like to restart this. Denies SI.      11/30/2022    2:31 PM 04/18/2022    2:26 PM 03/21/2022   12:25 PM  Depression screen PHQ 2/9  Decreased Interest 1 1 0  Down, Depressed, Hopeless 0 1 0  PHQ - 2 Score 1 2 0  Altered sleeping 2 2 0  Tired, decreased energy 0 1 1  Change in appetite 2 1 1   Feeling bad or failure about yourself  0 0 0  Trouble concentrating 0 0 0  Moving slowly or fidgety/restless 1 1 0  Suicidal thoughts 0 0 0  PHQ-9 Score 6 7 2   Difficult doing work/chores Somewhat difficult Somewhat difficult Not difficult at all      11/30/2022    2:32 PM 04/18/2022    2:26 PM 03/21/2022   12:25 PM 02/23/2022   11:03 AM  GAD 7 : Generalized Anxiety Score  Nervous, Anxious, on Edge 0 1 1 0  Control/stop worrying 2 1 1  0  Worry too much - different things 2 1 1  0  Trouble relaxing 1 1  0  Restless 1 1 1  0  Easily annoyed or irritable 2 1 0 0  Afraid - awful might happen 0 1 1 0  Total GAD 7 Score 8 7  0  Anxiety Difficulty Somewhat difficult Somewhat difficult  Not difficult at all     ROS As per HPI.      Objective:    BP  127/67   Pulse 77   Temp 98.4 F (36.9 C) (Temporal)   Ht 6\' 1"  (1.854 m)   Wt 191 lb 6 oz (86.8 kg)   SpO2 99%   BMI 25.25 kg/m    Physical Exam Vitals and nursing note reviewed.  Constitutional:      General: He is not in acute distress.    Appearance: He is well-developed. He is not ill-appearing, toxic-appearing or diaphoretic.  HENT:     Head: Normocephalic and atraumatic.     Nose: Rhinorrhea present.     Mouth/Throat:     Mouth: Mucous membranes are moist.     Pharynx: Oropharynx is clear.  Eyes:     Extraocular Movements: Extraocular movements intact.     Pupils: Pupils are equal, round, and reactive to light.  Cardiovascular:     Rate and Rhythm: Normal rate and regular rhythm.     Heart sounds: Normal heart sounds. No murmur heard. Pulmonary:     Effort: Pulmonary effort is normal. No respiratory distress.     Breath sounds:  Normal breath sounds.  Abdominal:     General: Bowel sounds are normal. There is no distension.     Palpations: Abdomen is soft.  Musculoskeletal:     Cervical back: Normal range of motion and neck supple. No rigidity.  Lymphadenopathy:     Cervical: No cervical adenopathy.  Skin:    General: Skin is warm and dry.  Neurological:     Mental Status: He is alert and oriented to person, place, and time.     Cranial Nerves: No cranial nerve deficit or facial asymmetry.     Motor: No weakness.     Coordination: Romberg sign negative. Coordination normal.     Gait: Gait normal.  Psychiatric:        Mood and Affect: Mood normal.        Behavior: Behavior normal.     No results found for any visits on 11/30/22.      Assessment & Plan:   Troy Collins was seen today for headache.  Diagnoses and all orders for this visit:  Nonintractable episodic headache, unspecified headache type Try naproxen as below. Benign exam. Return to office for new or worsening symptoms, or if symptoms persist.  -     naproxen (NAPROSYN) 250 MG tablet; Take 2  tablets (500 mg total) by mouth 2 (two) times daily with a meal.  Seasonal allergies Start daily OTC anithistamine  Blepharospasm Discussed decrease stress and increase sleep. Return to office for new or worsening symptoms, or if symptoms persist.   Anxiety Not well controlled. Restart zoloft.  -     sertraline (ZOLOFT) 50 MG tablet; Take 1 tablet (50 mg total) by mouth daily. For anxiety   Return in about 6 weeks (around 01/11/2023) for medication follow up with PCP.  The patient indicates understanding of these issues and agrees with the plan.  Gabriel Earing, FNP

## 2022-12-22 ENCOUNTER — Other Ambulatory Visit: Payer: Self-pay | Admitting: Family Medicine

## 2022-12-22 DIAGNOSIS — F419 Anxiety disorder, unspecified: Secondary | ICD-10-CM

## 2023-01-12 ENCOUNTER — Ambulatory Visit: Payer: Medicaid Other | Admitting: Family Medicine

## 2023-02-06 ENCOUNTER — Encounter: Payer: Self-pay | Admitting: Family Medicine

## 2023-02-07 ENCOUNTER — Other Ambulatory Visit (INDEPENDENT_AMBULATORY_CARE_PROVIDER_SITE_OTHER): Payer: Medicaid Other

## 2023-02-07 ENCOUNTER — Encounter: Payer: Self-pay | Admitting: Family Medicine

## 2023-02-07 ENCOUNTER — Ambulatory Visit (INDEPENDENT_AMBULATORY_CARE_PROVIDER_SITE_OTHER): Payer: Medicaid Other | Admitting: Family Medicine

## 2023-02-07 VITALS — Temp 98.6°F | Ht 73.0 in | Wt 183.0 lb

## 2023-02-07 DIAGNOSIS — R42 Dizziness and giddiness: Secondary | ICD-10-CM

## 2023-02-07 DIAGNOSIS — R631 Polydipsia: Secondary | ICD-10-CM | POA: Diagnosis not present

## 2023-02-07 DIAGNOSIS — Z720 Tobacco use: Secondary | ICD-10-CM

## 2023-02-07 DIAGNOSIS — R6889 Other general symptoms and signs: Secondary | ICD-10-CM | POA: Diagnosis not present

## 2023-02-07 DIAGNOSIS — F419 Anxiety disorder, unspecified: Secondary | ICD-10-CM | POA: Diagnosis not present

## 2023-02-07 DIAGNOSIS — R3989 Other symptoms and signs involving the genitourinary system: Secondary | ICD-10-CM | POA: Diagnosis not present

## 2023-02-07 DIAGNOSIS — R0789 Other chest pain: Secondary | ICD-10-CM

## 2023-02-07 DIAGNOSIS — R7309 Other abnormal glucose: Secondary | ICD-10-CM | POA: Diagnosis not present

## 2023-02-07 MED ORDER — BUPROPION HCL ER (SR) 150 MG PO TB12: ORAL_TABLET | ORAL | 1 refills | Status: DC

## 2023-02-07 NOTE — Patient Instructions (Signed)
Bupropion Sustained-Release Tablets (Smoking Cessation) What is this medication? BUPROPION (byoo PROE pee on) helps you quit smoking. It reduces cravings for nicotine, the addictive substance found in tobacco. It may also help reduce symptoms of withdrawal. It is most effective when used in combination with a stop-smoking program. This medicine may be used for other purposes; ask your health care provider or pharmacist if you have questions. COMMON BRAND NAME(S): Buproban, Zyban What should I tell my care team before I take this medication? They need to know if you have any of these conditions: An eating disorder, such as anorexia or bulimia Bipolar disorder or psychosis Diabetes or high blood sugar, treated with medication Glaucoma Head injury or brain tumor Heart disease, previous heart attack, or irregular heart beat High blood pressure Kidney disease Liver disease Seizures Suicidal thoughts, plans, or attempt by you or a family member Tourette syndrome Weight loss An unusual or allergic reaction to bupropion, other medications, foods, dyes, or preservatives Pregnant or trying to become pregnant Breastfeeding How should I use this medication? Take this medication by mouth with a glass of water. Follow the directions on the prescription label. You can take it with or without food. If it upsets your stomach, take it with food. Do not cut, crush or chew this medication. Take your medication at regular intervals. If you take this medication more than once a day, take your second dose at least 8 hours after you take your first dose. To limit trouble in sleeping, avoid taking this medication at bedtime. Do not take your medication more often than directed. Do not stop taking this medication suddenly except upon the advice of your care team. Stopping this medication too quickly may cause serious side effects. A special MedGuide will be given to you by the pharmacist with each prescription and  refill. Be sure to read this information carefully each time. Talk to your care team about the use of this medication in children. Special care may be needed. Overdosage: If you think you have taken too much of this medicine contact a poison control center or emergency room at once. NOTE: This medicine is only for you. Do not share this medicine with others. What if I miss a dose? If you miss a dose, skip the missed dose and take your next tablet at the regular time. There should be at least 8 hours between doses. Do not take double or extra doses. What may interact with this medication? Do not take this medication with any of the following: Linezolid MAOIs, such as Azilect, Carbex, Eldepryl, Marplan, Nardil, and Parnate Methylene blue (injected into a vein) Other medications that contain bupropion, such as Wellbutrin This medication may also interact with the following: Alcohol Certain medications for anxiety or sleep Certain medications for blood pressure, such as metoprolol, propranolol Certain medications for HIV or AIDS, such as efavirenz, lopinavir, nelfinavir, ritonavir Certain medications for irregular heartbeat, such as propafenone, flecainide Certain medications for mental health conditions Certain medications for Parkinson disease, such as amantadine, levodopa Certain medications for seizures, such as carbamazepine, phenytoin, phenobarbital Cimetidine Clopidogrel Cyclophosphamide Digoxin Furazolidone Isoniazid Nicotine Orphenadrine Procarbazine Steroid medications, such as prednisone or cortisone Stimulant medications for attention disorders, weight loss, or to stay awake Tamoxifen Theophylline Thiotepa Ticlopidine Tramadol Warfarin This list may not describe all possible interactions. Give your health care provider a list of all the medicines, herbs, non-prescription drugs, or dietary supplements you use. Also tell them if you smoke, drink alcohol, or use illegal  drugs.  Some items may interact with your medicine. What should I watch for while using this medication? Visit your care team for regular checks on your progress. This medication should be used together with a patient support program. It is important to participate in a behavioral program, counseling, or other support program that is recommended by your care team. This medication may cause thoughts of suicide or depression. This includes sudden changes in mood, behaviors, or thoughts. These changes can happen at any time but are more common in the beginning of treatment or after a change in dose. Call your care team right away if you experience these thoughts or worsening depression. This medication may cause mood and behavior changes, such as anxiety, nervousness, irritability, hostility, restlessness, excitability, hyperactivity, or trouble sleeping. These changes can happen at any time but are more common in the beginning of treatment or after a change in dose. Call your care team right away if you notice any of these symptoms. This medication may cause serious skin reactions. They can happen weeks to months after starting the medication. Contact your care team right away if you notice fevers or flu-like symptoms with a rash. The rash may be red or purple and then turn into blisters or peeling of the skin. You may also notice a red rash with swelling of the face, lips, or lymph nodes in your neck or under your arms. Avoid drinks that contain alcohol while taking this medication. Drinking large amounts of alcohol, using sleeping or anxiety medications, or quickly stopping the use of these agents while taking this medication may increase your risk for a seizure. This medication may affect your coordination, reaction time, or judgment. Do not drive or operate machinery until you know how this medication affects you. Do not take this medication close to bedtime. It may prevent you from sleeping. Your mouth  may get dry. Chewing sugarless gum or sucking hard candy and drinking plenty of water may help. Contact your care team if the problem does not go away or is severe. Do not use nicotine patches or chewing gum without the advice of your care team while taking this medication. You may need to have your blood pressure taken regularly if your doctor recommends that you use both nicotine and this medication together. What side effects may I notice from receiving this medication? Side effects that you should report to your care team as soon as possible: Allergic reactions--skin rash, itching, hives, swelling of the face, lips, tongue, or throat Increase in blood pressure Mood and behavior changes--anxiety, nervousness, confusion, hallucinations, irritability, hostility, thoughts of suicide or self-harm, worsening mood, feelings of depression Redness, blistering, peeling, or loosening of the skin, including inside the mouth Seizures Sudden eye pain or change in vision such as blurry vision, seeing halos around lights, vision loss Side effects that usually do not require medical attention (report to your care team if they continue or are bothersome): Constipation Dizziness Dry mouth Loss of appetite Nausea Tremors or shaking Trouble sleeping This list may not describe all possible side effects. Call your doctor for medical advice about side effects. You may report side effects to FDA at 1-800-FDA-1088. Where should I keep my medication? Keep out of the reach of children and pets. Store at room temperature between 20 and 25 degrees C (68 and 77 degrees F). Protect from light. Keep container tightly closed. Throw away any unused medication after the expiration date. NOTE: This sheet is a summary. It may not cover all possible  information. If you have questions about this medicine, talk to your doctor, pharmacist, or health care provider.  2024 Elsevier/Gold Standard (2022-04-16 00:00:00)

## 2023-02-07 NOTE — Progress Notes (Signed)
Acute Office Visit  Subjective:     Patient ID: Troy Collins, male    DOB: Sep 15, 2000, 22 y.o.   MRN: [id number]  Chief Complaint  Patient presents with   URINE COLOR DARKER    Chest Pain   DIZZY FOR 3 WEEKS     Chest Pain    Patient is in today for dizziness.   Reports that he has episodes where he feels really thirsty and lightheaded. This has happened daily for the last 2-3 weeks. He also has chest pressure on the left side that extends down his left arm. A little difficult to breathe when this dizziness and palpitations. He will have blurred vision for a moment as well. This happen more when he is resting, less so when he is active. He has been feeling anxious lately. His urine has been darker as well. He drinks three 16 ounces bottles of water a day. He drinks 1 soda a day, no other caffeine. He does work in a hot environment in a plant.   Hx of anxiety. He tried zoloft for 2-3 weeks without improvement. He took hydrozyzien once without improvement.   He is interested in trying Wellbutrin to help him stop smoking as well as help with his anxiety.      11/30/2022    2:31 PM 04/18/2022    2:26 PM 03/21/2022   12:25 PM  Depression screen PHQ 2/9  Decreased Interest 1 1 0  Down, Depressed, Hopeless 0 1 0  PHQ - 2 Score 1 2 0  Altered sleeping 2 2 0  Tired, decreased energy 0 1 1  Change in appetite 2 1 1   Feeling bad or failure about yourself  0 0 0  Trouble concentrating 0 0 0  Moving slowly or fidgety/restless 1 1 0  Suicidal thoughts 0 0 0  PHQ-9 Score 6 7 2   Difficult doing work/chores Somewhat difficult Somewhat difficult Not difficult at all      11/30/2022    2:32 PM 04/18/2022    2:26 PM 03/21/2022   12:25 PM 02/23/2022   11:03 AM  GAD 7 : Generalized Anxiety Score  Nervous, Anxious, on Edge 0 1 1 0  Control/stop worrying 2 1 1  0  Worry too much - different things 2 1 1  0  Trouble relaxing 1 1  0  Restless 1 1 1  0  Easily annoyed or irritable 2 1 0 0   Afraid - awful might happen 0 1 1 0  Total GAD 7 Score 8 7  0  Anxiety Difficulty Somewhat difficult Somewhat difficult  Not difficult at all     Review of Systems  Cardiovascular:  Positive for chest pain.   As per HPI.      Objective:   BP Readings from Last 3 Encounters:  11/30/22 127/67  04/18/22 124/80  03/21/22 107/73    Temp 98.6 F (37 C) (Temporal)   Ht 6\' 1"  (1.854 m)   Wt 183 lb (83 kg)   SpO2 99%   BMI 24.14 kg/m    Physical Exam Vitals and nursing note reviewed.  Constitutional:      General: He is not in acute distress.    Appearance: He is well-developed. He is not ill-appearing, toxic-appearing or diaphoretic.  HENT:     Head: Normocephalic and atraumatic.  Eyes:     Extraocular Movements: Extraocular movements intact.     Pupils: Pupils are equal, round, and reactive to light.  Neck:  Thyroid: No thyroid mass, thyromegaly or thyroid tenderness.  Cardiovascular:     Rate and Rhythm: Normal rate and regular rhythm.     Heart sounds: Normal heart sounds. No murmur heard. Pulmonary:     Effort: Pulmonary effort is normal. No tachypnea.     Breath sounds: Normal breath sounds. No decreased breath sounds.  Musculoskeletal:     Cervical back: Neck supple.     Right lower leg: No edema.     Left lower leg: No edema.  Skin:    General: Skin is warm and dry.  Neurological:     General: No focal deficit present.     Mental Status: He is alert and oriented to person, place, and time.     Cranial Nerves: No cranial nerve deficit.     Motor: No weakness.  Psychiatric:        Mood and Affect: Mood normal.        Behavior: Behavior normal.     No results found for any visits on 02/07/23.      Assessment & Plan:   [name] was seen today for urine color darker , chest pain and dizzy for 3 weeks .  Diagnoses and all orders for this visit:  Dizziness EKG with NSR today. Negative orthostatic today. Will check labs as below. Zio ordered as  well.  -     LONG TERM MONITOR (3-14 DAYS); Future -     Anemia Profile B -     BMP8+EGFR -     TSH  Chest pressure -     EKG 12-Lead -     LONG TERM MONITOR (3-14 DAYS); Future -     Anemia Profile B -     BMP8+EGFR -     TSH  Abnormal urine color -     Urinalysis, Complete  Increased thirst -     TSH -     Bayer DCA Hb A1c Waived  Anxiety -     buPROPion (WELLBUTRIN SR) 150 MG 12 hr tablet; Take 1 tablet by mouth daily for 3 day. After 3 days, increase to tablet by mouth twice a day.  Tobacco abuse -     buPROPion (WELLBUTRIN SR) 150 MG 12 hr tablet; Take 1 tablet by mouth daily for 3 day. After 3 days, increase to tablet by mouth twice a day.  Negative orthostatics today. EKG with NSR. Labs pending. UA with ketones, otherwise normal. Increase hydration. Will try wellbutrin for smoking cessation and anxiety. Zio applied today as well. Discussed that I suspect anxiety as cause, however want to rule other other possible etiologies.   Return in about 6 weeks (around 03/21/2023) for follow up with PCP.  The patient indicates understanding of these issues and agrees with the plan.  Gabriel Earing, FNP

## 2023-02-08 LAB — ANEMIA PROFILE B
Basophils Absolute: 0.1 10*3/uL (ref 0.0–0.2)
Basos: 1 %
EOS (ABSOLUTE): 0.1 10*3/uL (ref 0.0–0.4)
Eos: 1 %
Ferritin: 159 ng/mL (ref 30–400)
Folate: 10.9 ng/mL (ref >3.0)
Hematocrit: 42.3 % (ref 37.5–51.0)
Hemoglobin: 14.3 g/dL (ref 13.0–17.7)
Immature Grans (Abs): 0 10*3/uL (ref 0.0–0.1)
Immature Granulocytes: 0 %
Iron Saturation: 20 % (ref 15–55)
Iron: 65 ug/dL (ref 38–169)
Lymphocytes Absolute: 2.4 10*3/uL (ref 0.7–3.1)
Lymphs: 34 %
MCH: 29.2 pg (ref 26.6–33.0)
MCHC: 33.8 g/dL (ref 31.5–35.7)
MCV: 86 fL (ref 79–97)
Monocytes Absolute: 0.7 10*3/uL (ref 0.1–0.9)
Monocytes: 10 %
Neutrophils Absolute: 3.8 10*3/uL (ref 1.4–7.0)
Neutrophils: 54 %
Platelets: 359 10*3/uL (ref 150–450)
RBC: 4.9 x10E6/uL (ref 4.14–5.80)
RDW: 12.2 % (ref 11.6–15.4)
Retic Ct Pct: 1 % (ref 0.6–2.6)
Total Iron Binding Capacity: 326 ug/dL (ref 250–450)
UIBC: 261 ug/dL (ref 111–343)
Vitamin B-12: 565 pg/mL (ref 232–1245)
WBC: 7 10*3/uL (ref 3.4–10.8)

## 2023-02-08 LAB — BMP8+EGFR
BUN/Creatinine Ratio: 10 (ref 9–20)
BUN: 10 mg/dL (ref 6–20)
CO2: 22 mmol/L (ref 20–29)
Calcium: 10 mg/dL (ref 8.7–10.2)
Chloride: 104 mmol/L (ref 96–106)
Creatinine, Ser: 0.96 mg/dL (ref 0.76–1.27)
Glucose: 93 mg/dL (ref 70–99)
Potassium: 5.1 mmol/L (ref 3.5–5.2)
Sodium: 141 mmol/L (ref 134–144)
eGFR: 116 mL/min/{1.73_m2} (ref >59)

## 2023-02-08 LAB — TSH: TSH: 0.688 u[IU]/mL (ref 0.450–4.500)

## 2023-02-09 ENCOUNTER — Encounter: Payer: Self-pay | Admitting: Family Medicine

## 2023-02-09 LAB — URINALYSIS, COMPLETE
Bilirubin, UA: NEGATIVE
Glucose, UA: NEGATIVE
Leukocytes,UA: NEGATIVE
Nitrite, UA: NEGATIVE
Protein,UA: NEGATIVE
RBC, UA: NEGATIVE
Specific Gravity, UA: 1.02 (ref 1.005–1.030)
Urobilinogen, Ur: 1 mg/dL (ref 0.2–1.0)
pH, UA: 6 (ref 5.0–7.5)

## 2023-02-09 LAB — BAYER DCA HB A1C WAIVED: HB A1C (BAYER DCA - WAIVED): 5.3 % (ref 4.8–5.6)

## 2023-02-14 ENCOUNTER — Encounter: Payer: Self-pay | Admitting: Nurse Practitioner

## 2023-02-14 ENCOUNTER — Ambulatory Visit (INDEPENDENT_AMBULATORY_CARE_PROVIDER_SITE_OTHER): Payer: Medicaid Other

## 2023-02-14 ENCOUNTER — Ambulatory Visit (INDEPENDENT_AMBULATORY_CARE_PROVIDER_SITE_OTHER): Payer: Medicaid Other | Admitting: Nurse Practitioner

## 2023-02-14 VITALS — BP 113/66 | HR 75 | Temp 98.6°F | Ht 73.0 in | Wt 185.6 lb

## 2023-02-14 DIAGNOSIS — M7989 Other specified soft tissue disorders: Secondary | ICD-10-CM | POA: Diagnosis not present

## 2023-02-14 DIAGNOSIS — M79641 Pain in right hand: Secondary | ICD-10-CM

## 2023-02-14 MED ORDER — ACETAMINOPHEN 500 MG PO TABS: 500.0000 mg | ORAL_TABLET | Freq: Four times a day (QID) | ORAL | 0 refills | Status: DC | PRN

## 2023-02-14 NOTE — Progress Notes (Unsigned)
   Acute Office Visit  Subjective:     Patient ID: Troy Collins, male    DOB: 2001/08/03, 22 y.o.   MRN: [id number]  No chief complaint on file.   HPI SUBJECTIVE: Troy Collins is a 22 y.o. male who sustained a {gen laterality:315002} hand injury *** {gen duration:315003} ago. Mechanism of injury: ***. Immediate symptoms: {sx:315767}. Symptoms have been {gen onset/course:315708} since that time. Prior history of related problems: {past sx:315768}.     ROS Negative unless indicated in HPI    Objective:    There were no vitals taken for this visit. BP Readings from Last 3 Encounters:  02/14/23 113/66  11/30/22 127/67  04/18/22 124/80   Wt Readings from Last 3 Encounters:  02/14/23 185 lb 9.6 oz (84.2 kg)  02/07/23 183 lb (83 kg)  11/30/22 191 lb 6 oz (86.8 kg)      Physical Exam OBJECTIVE: Vital signs as noted above. Appearance: {appearance:315021::alert, well appearing, and in no distress}. Hand exam: {hand UXLK:440102}. X-ray: {xr findings:315769}. No results found for any visits on 02/14/23.      Assessment & Plan:  There are no diagnoses linked to this encounter. ASSESSMENT: hand {dx:315770}  PLAN: {plan:315771} See orders for this visit as documented in the electronic medical record.  No follow-ups on file.  Arrie Aran Santa Lighter, DNP [facility] [address] [place], [place] [address] [phone]

## 2023-02-14 NOTE — Progress Notes (Signed)
   Acute Office Visit  Subjective:     Patient ID: Troy Collins, male    DOB: 25-Feb-2001, 22 y.o.   MRN: [id number]  Chief Complaint  Patient presents with   Hand Pain    Hurt right hand yesterday after punching a barn.    HPI SUBJECTIVE: Troy Collins is a 22 y.o. male who sustained a right hand injury1day(s) ago. Mechanism of injury: punched a wall. Immediate symptoms: immediate pain, was able to use arm directly after injury. Symptoms have been acute since that time. Prior history of related problems: no prior problems with this area in the past.  Review of Systems  Constitutional:  Negative for chills and fever.  Cardiovascular:  Negative for chest pain.  Musculoskeletal:  Positive for joint pain.       Right Wrist/hand  Neurological:  Negative for tingling and tremors.   Negative unless indicated in HPI    Objective:    BP 113/66   Pulse 75   Temp 98.6 F (37 C) (Temporal)   Ht 6\' 1"  (1.854 m)   Wt 185 lb 9.6 oz (84.2 kg)   SpO2 97%   BMI 24.49 kg/m  BP Readings from Last 3 Encounters:  02/14/23 113/66  11/30/22 127/67  04/18/22 124/80   Wt Readings from Last 3 Encounters:  02/14/23 185 lb 9.6 oz (84.2 kg)  02/07/23 183 lb (83 kg)  11/30/22 191 lb 6 oz (86.8 kg)      Physical Exam Vital signs as noted above. Appearance: alert, well appearing, and in no distress and well hydrated. Hand exam: normal hand/wrist exam, no swelling, tenderness, instability. Ligaments intact, FROM all joints, soft tissue tenderness and swelling at the ulna. X-ray: ordered, but results not yet available, no fracture or dislocation noted, pending review by radiologist. No results found for any visits on 02/14/23.      Assessment & Plan:  Pain of right hand -     DG Hand Complete Right -     Acetaminophen; Take 1 tablet (500 mg total) by mouth every 6 (six) hours as needed.  Dispense: 30 tablet; Refill: 0  Hand swelling -     DG Hand Complete Right -      Acetaminophen; Take 1 tablet (500 mg total) by mouth every 6 (six) hours as needed.  Dispense: 30 tablet; Refill: 0   ASSESSMENT: hand strain  PLAN: rest the injured area as much as practical, apply ice packs, purchase a splint, X-Ray ordered, prescription for NSAID given, note for work See orders for this visit as documented in the electronic medical record.  Return if symptoms worsen or fail to improve.  Arrie Aran Santa Lighter, DNP [facility] [address] [place], [place] [address] [phone]

## 2023-02-15 NOTE — Telephone Encounter (Signed)
Mother calls back and states patient wants heart monitor mailed to him.

## 2023-02-15 NOTE — Progress Notes (Signed)
Final x-ray report There is no evidence of fracture or dislocation. Soft tissues are unremarkable.

## 2023-03-03 ENCOUNTER — Other Ambulatory Visit: Payer: Self-pay | Admitting: Family Medicine

## 2023-03-03 DIAGNOSIS — Z720 Tobacco use: Secondary | ICD-10-CM

## 2023-03-03 DIAGNOSIS — F419 Anxiety disorder, unspecified: Secondary | ICD-10-CM

## 2023-03-08 ENCOUNTER — Encounter: Payer: Self-pay | Admitting: *Deleted

## 2023-03-23 ENCOUNTER — Ambulatory Visit: Payer: Medicaid Other | Admitting: Family Medicine

## 2023-03-26 ENCOUNTER — Encounter: Payer: Self-pay | Admitting: Family Medicine

## 2023-04-05 DIAGNOSIS — H5213 Myopia, bilateral: Secondary | ICD-10-CM | POA: Diagnosis not present

## 2023-06-25 ENCOUNTER — Encounter: Payer: Self-pay | Admitting: Nurse Practitioner

## 2023-06-25 ENCOUNTER — Ambulatory Visit: Payer: Medicaid Other | Admitting: Nurse Practitioner

## 2023-06-25 VITALS — BP 116/61 | HR 72 | Temp 98.8°F | Ht 73.0 in | Wt 172.0 lb

## 2023-06-25 DIAGNOSIS — K5901 Slow transit constipation: Secondary | ICD-10-CM | POA: Diagnosis not present

## 2023-06-25 DIAGNOSIS — R0789 Other chest pain: Secondary | ICD-10-CM

## 2023-06-25 DIAGNOSIS — K219 Gastro-esophageal reflux disease without esophagitis: Secondary | ICD-10-CM

## 2023-06-25 MED ORDER — OMEPRAZOLE 40 MG PO CPDR
40.0000 mg | DELAYED_RELEASE_CAPSULE | Freq: Every day | ORAL | 3 refills | Status: DC
Start: 2023-06-25 — End: 2023-09-17

## 2023-06-25 NOTE — Patient Instructions (Signed)
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
## 2023-06-29 ENCOUNTER — Encounter: Payer: Self-pay | Admitting: Family Medicine

## 2023-06-29 ENCOUNTER — Telehealth: Payer: Medicaid Other

## 2023-06-29 ENCOUNTER — Ambulatory Visit (INDEPENDENT_AMBULATORY_CARE_PROVIDER_SITE_OTHER): Payer: Medicaid Other | Admitting: Family Medicine

## 2023-06-29 ENCOUNTER — Telehealth: Payer: Self-pay | Admitting: Family Medicine

## 2023-06-29 DIAGNOSIS — J0110 Acute frontal sinusitis, unspecified: Secondary | ICD-10-CM

## 2023-06-29 MED ORDER — CEFDINIR 300 MG PO CAPS
300.0000 mg | ORAL_CAPSULE | Freq: Two times a day (BID) | ORAL | 0 refills | Status: DC
Start: 2023-06-29 — End: 2023-09-05

## 2023-06-29 MED ORDER — FLUTICASONE PROPIONATE 50 MCG/ACT NA SUSP
1.0000 | Freq: Two times a day (BID) | NASAL | 1 refills | Status: DC | PRN
Start: 2023-06-29 — End: 2023-09-05

## 2023-06-29 NOTE — Telephone Encounter (Signed)
Copied from CRM [id number]. Topic: Clinical - Medical Advice >> Jun 29, 2023  3:17 PM Joanette Gula wrote: Reason for CRM: Pt is returning a call from Dr. Nadine Counts.

## 2023-06-29 NOTE — Telephone Encounter (Signed)
Dettinger is attempting to call one more time for video visit.

## 2023-06-29 NOTE — Addendum Note (Signed)
Addended by: Arville Care on: 06/29/2023 04:43 PM   Modules accepted: Orders, Level of Service

## 2023-06-29 NOTE — Progress Notes (Addendum)
Virtual Visit via MyChart video note  I connected with Troy Collins on 06/29/23 at 1633 by video and verified that I am speaking with the correct person using two identifiers. Troy Collins is currently located at home and patient are currently with her during visit. The provider, Elige Radon Jyair Kiraly, MD is located in their office at time of visit.  Call ended at 1643  I discussed the limitations, risks, security and privacy concerns of performing an evaluation and management service by video and the availability of in person appointments. I also discussed with the patient that there may be a patient responsible charge related to this service. The patient expressed understanding and agreed to proceed.   History and Present Illness: Patient is calling in for sinus headache and pressure.  He is having a lot of pressure in his ears and ear plugs are making it worse.  He started out with sore throat and then drainage and then worsened.  He called out of work today.   He denies fevers or chills or body aches.  He has been using ibuprofen and it is helping some.  He denies SOB more than before.     Outpatient Encounter Medications as of 06/29/2023  Medication Sig   cefdinir (OMNICEF) 300 MG capsule Take 1 capsule (300 mg total) by mouth 2 (two) times daily. 1 po BID   fluticasone (FLONASE) 50 MCG/ACT nasal spray Place 1 spray into both nostrils 2 (two) times daily as needed for allergies or rhinitis.   omeprazole (PRILOSEC) 40 MG capsule Take 1 capsule (40 mg total) by mouth daily.   No facility-administered encounter medications on file as of 06/29/2023.    Review of Systems  Constitutional:  Negative for chills and fever.  HENT:  Positive for congestion, postnasal drip, rhinorrhea, sinus pressure, sneezing and sore throat. Negative for ear discharge, ear pain and voice change.   Eyes:  Negative for pain, discharge, redness and visual disturbance.  Respiratory:  Positive for cough.  Negative for shortness of breath and wheezing.   Cardiovascular:  Negative for chest pain and leg swelling.  Musculoskeletal:  Negative for gait problem.  Skin:  Negative for rash.  Neurological:  Positive for headaches.  All other systems reviewed and are negative.   Observations/Objective: Patient sounds comfortable and in no acute distress  Assessment and Plan: Problem List Items Addressed This Visit   None Visit Diagnoses     Acute non-recurrent frontal sinusitis    -  Primary   Relevant Medications   cefdinir (OMNICEF) 300 MG capsule   fluticasone (FLONASE) 50 MCG/ACT nasal spray       Sent flonase and cefdinir and flonase Follow up plan: Return if symptoms worsen or fail to improve.     I discussed the assessment and treatment plan with the patient. The patient was provided an opportunity to ask questions and all were answered. The patient agreed with the plan and demonstrated an understanding of the instructions.   The patient was advised to call back or seek an in-person evaluation if the symptoms worsen or if the condition fails to improve as anticipated.  The above assessment and management plan was discussed with the patient. The patient verbalized understanding of and has agreed to the management plan. Patient is aware to call the clinic if symptoms persist or worsen. Patient is aware when to return to the clinic for a follow-up visit. Patient educated on when it is appropriate to go to the emergency  department.    I provided 10 minutes of non-face-to-face time during this encounter.    [name], MD

## 2023-08-08 ENCOUNTER — Telehealth: Payer: Medicaid Other

## 2023-08-08 DIAGNOSIS — R6889 Other general symptoms and signs: Secondary | ICD-10-CM

## 2023-08-08 MED ORDER — SUCRALFATE 1 G PO TABS: 1.0000 g | ORAL_TABLET | Freq: Three times a day (TID) | ORAL | 0 refills | Status: DC

## 2023-08-08 MED ORDER — ONDANSETRON 4 MG PO TBDP: 4.0000 mg | ORAL_TABLET | Freq: Three times a day (TID) | ORAL | 0 refills | Status: DC | PRN

## 2023-08-08 NOTE — Patient Instructions (Signed)
 Troy Collins, thank you for joining Delon CHRISTELLA Dickinson, PA-C for today's virtual visit.  While this provider is not your primary care provider (PCP), if your PCP is located in our provider database this encounter information will be shared with them immediately following your visit.   A [facility] MyChart account gives you access to today's visit and all your visits, tests, and labs performed at [facility]  click here if you don't have a [facility] MyChart account or go to [email or web address]  Consent: (Patient) Mateusz C Preston provided verbal consent for this virtual visit at the beginning of the encounter.  Current Medications:  Current Outpatient Medications:    ondansetron  (ZOFRAN -ODT) 4 MG disintegrating tablet, Take 1-2 tablets (4-8 mg total) by mouth every 8 (eight) hours as needed., Disp: 20 tablet, Rfl: 0   sucralfate  (CARAFATE ) 1 g tablet, Take 1 tablet (1 g total) by mouth 4 (four) times daily -  with meals and at bedtime., Disp: 28 tablet, Rfl: 0   cefdinir  (OMNICEF ) 300 MG capsule, Take 1 capsule (300 mg total) by mouth 2 (two) times daily. 1 po BID, Disp: 20 capsule, Rfl: 0   fluticasone  (FLONASE ) 50 MCG/ACT nasal spray, Place 1 spray into both nostrils 2 (two) times daily as needed for allergies or rhinitis., Disp: 16 g, Rfl: 1   omeprazole  (PRILOSEC) 40 MG capsule, Take 1 capsule (40 mg total) by mouth daily., Disp: 30 capsule, Rfl: 3   Medications ordered in this encounter:  Meds ordered this encounter  Medications   ondansetron  (ZOFRAN -ODT) 4 MG disintegrating tablet    Sig: Take 1-2 tablets (4-8 mg total) by mouth every 8 (eight) hours as needed.    Dispense:  20 tablet    Refill:  0    Supervising Provider:   LAMPTEY, PHILIP O [[id number]]   sucralfate  (CARAFATE ) 1 g tablet    Sig: Take 1 tablet (1 g total) by mouth 4 (four) times daily -  with meals and at bedtime.    Dispense:  28 tablet    Refill:  0    Supervising Provider:   BLAISE ALEENE KIDD [[id number]]     *If you need refills on other medications prior to your next appointment, please contact your pharmacy*  Follow-Up: Call back or seek an in-person evaluation if the symptoms worsen or if the condition fails to improve as anticipated.  [facility] [phone]  Other Instructions Influenza, Adult Influenza is also called the flu. It's an infection that affects your respiratory tract. This includes your nose, throat, windpipe, and lungs. The flu is contagious. This means it spreads easily from person to person. It causes symptoms that are like a cold. It can also cause a high fever and body aches. What are the causes? The flu is caused by the influenza virus. You can get it by: Breathing in droplets that are in the air after an infected person coughs or sneezes. Touching something that has the virus on it and then touching your mouth, nose, or eyes. What increases the risk? You may be more likely to get the flu if: You don't wash your hands often. You're near a lot of people during cold and flu season. You touch your mouth, eyes, or nose without washing your hands first. You don't get a flu shot each year. You may also be more at risk for the flu and serious problems, such as a lung infection called pneumonia, if: You're older than 65. Charletta  pregnant. Your immune system is weak. Your immune system is your body's defense system. You have a long-term, or chronic, condition, such as: Heart, kidney, or lung disease. Diabetes. A liver disorder. Asthma. You're very overweight. You have anemia. This is when you don't have enough red blood cells in your body. What are the signs or symptoms? Flu symptoms often start all of a sudden. They may last 4-14 days and include: Fever and chills. Headaches, body aches, or muscle aches. Sore throat. Cough. Runny or stuffy nose. Discomfort in your chest. Not wanting to eat as much as normal. Feeling weak or  tired. Feeling dizzy. Nausea or vomiting. How is this diagnosed? The flu may be diagnosed based on your symptoms and medical history. You may also have a physical exam. A swab may be taken from your nose or throat and tested for the virus. How is this treated? If the flu is found early, you can be treated with antiviral medicine. This may be given to you by mouth or through an IV. It can help you feel less sick and get better faster. Taking care of yourself at home can also help your symptoms get better. Your health care provider may tell you to: Take over-the-counter medicines. Drink lots of fluids. The flu often goes away on its own. If you have very bad symptoms or problems caused by the flu, you may need to be treated in a hospital. Follow these instructions at home: Activity Rest as needed. Get lots of sleep. Stay home from work or school as told by your provider. Leave home only to go see your provider. Do not leave home for other reasons until you don't have a fever for 24 hours without taking medicine. Eating and drinking Take an oral rehydration solution (ORS). This is a drink that is sold at pharmacies and stores. Drink enough fluid to keep your pee pale yellow. Try to drink small amounts of clear fluids. These include water, ice chips, fruit juice mixed with water, and low-calorie sports drinks. Try to eat bland foods that are easy to digest. These include bananas, applesauce, rice, lean meats, toast, and crackers. Avoid drinks that have a lot of sugar or caffeine in them. These include energy drinks, regular sports drinks, and soda. Do not drink alcohol. Do not eat spicy or fatty foods. General instructions     Take your medicines only as told by your provider. Use a cool mist humidifier to add moisture to the air in your home. This can make it easier for you to breathe. You should also clean the humidifier every day. To do so: Empty the water. Pour clean water in. Cover  your mouth and nose when you cough or sneeze. Wash your hands with soap and water often and for at least 20 seconds. It's extra important to do so after you cough or sneeze. If you can't use soap and water, use hand sanitizer. How is this prevented?  Get a flu shot every year. Ask your provider when you should get your flu shot. Stay away from people who are sick during fall and winter. Fall and winter are cold and flu season. Contact a health care provider if: You get new symptoms. You have chest pain. You have watery poop, also called diarrhea. You have a fever. Your cough gets worse. You start to have more mucus. You feel like you may vomit, or you vomit. Get help right away if: You become short of breath or have trouble breathing.  Your skin or nails turn blue. You have very bad pain or stiffness in your neck. You get a sudden headache or pain in your face or ear. You vomit each time you eat or drink. These symptoms may be an emergency. Call 911 right away. Do not wait to see if the symptoms will go away. Do not drive yourself to the hospital. This information is not intended to replace advice given to you by your health care provider. Make sure you discuss any questions you have with your health care provider. Document Revised: 08/31/2022 Document Reviewed: 08/31/2022 Elsevier Patient Education  2024 Elsevier Inc.    If you have been instructed to have an in-person evaluation today at a local Urgent Care facility, please use the link below. It will take you to a list of all of our available [facility], including address, phone number and hours of operation. Please do not delay care.  [facility]  If you or a family member do not have a primary care provider, use the link below to schedule a visit and establish care. When you choose a [facility] primary care physician or advanced practice provider, you gain a long-term partner in health. Find a Primary  Care Provider  Learn more about [facility]'s in-office and virtual care options: [facility] - Get Care Now

## 2023-08-08 NOTE — Progress Notes (Signed)
 Virtual Visit Consent   Troy Collins, you are scheduled for a virtual visit with a [facility] provider today. Just as with appointments in the office, your consent must be obtained to participate. Your consent will be active for this visit and any virtual visit you may have with one of our providers in the next 365 days. If you have a MyChart account, a copy of this consent can be sent to you electronically.  As this is a virtual visit, video technology does not allow for your provider to perform a traditional examination. This may limit your provider's ability to fully assess your condition. If your provider identifies any concerns that need to be evaluated in person or the need to arrange testing (such as labs, EKG, etc.), we will make arrangements to do so. Although advances in technology are sophisticated, we cannot ensure that it will always work on either your end or our end. If the connection with a video visit is poor, the visit may have to be switched to a telephone visit. With either a video or telephone visit, we are not always able to ensure that we have a secure connection.  By engaging in this virtual visit, you consent to the provision of healthcare and authorize for your insurance to be billed (if applicable) for the services provided during this visit. Depending on your insurance coverage, you may receive a charge related to this service.  I need to obtain your verbal consent now. Are you willing to proceed with your visit today? Troy Collins has provided verbal consent on 08/08/2023 for a virtual visit (video or telephone). Delon CHRISTELLA Dickinson, PA-C  Date: 08/08/2023 12:51 PM  Virtual Visit via Video Note   I, Delon CHRISTELLA Dickinson, connected with  Troy Collins  ([id number], Jan 25, 2001) on 08/08/23 at 12:45 PM EST by a video-enabled telemedicine application and verified that I am speaking with the correct person using two identifiers.  Location: Patient: Virtual Visit  Location Patient: Home Provider: Virtual Visit Location Provider: Home Office   I discussed the limitations of evaluation and management by telemedicine and the availability of in person appointments. The patient expressed understanding and agreed to proceed.    History of Present Illness: Troy Collins is a 23 y.o. who identifies as a male who was assigned male at birth, and is being seen today for GI symptoms and body aches.  HPI: GI Problem The primary symptoms include fever (subjective; cold sweats over night), fatigue, nausea, diarrhea (off and on) and myalgias. Primary symptoms do not include weight loss, vomiting, melena, hematemesis, hematochezia or dysuria. The illness began 3 to 5 days ago (Symptoms started Monday). The onset was sudden. The problem has not changed since onset. The illness is also significant for chills and bloating (worsening acute on chronic issue). The illness does not include tenesmus.  URI  The current episode started in the past 7 days (Monday). Associated symptoms include coughing (mild), diarrhea (off and on), headaches and nausea. Pertinent negatives include no congestion, dysuria, ear pain, plugged ear sensation, rhinorrhea, sinus pain, sore throat, vomiting or wheezing. Treatments tried: omeprazole , gas-x, melatonin. The treatment provided no relief.      Problems:  Patient Active Problem List   Diagnosis Date Noted   Pain of right hand 02/14/2023   Hand swelling 02/14/2023   Cough with fever 01/12/2021   Encounter for routine child health examination without abnormal findings 04/02/2020   Infection of nail bed of toe of left foot  01/23/2020   Fungal skin infection 01/23/2020   Childhood obesity 03/30/2015    Allergies:  Allergies  Allergen Reactions   Penicillins    Medications:  Current Outpatient Medications:    ondansetron  (ZOFRAN -ODT) 4 MG disintegrating tablet, Take 1-2 tablets (4-8 mg total) by mouth every 8 (eight) hours as needed.,  Disp: 20 tablet, Rfl: 0   sucralfate  (CARAFATE ) 1 g tablet, Take 1 tablet (1 g total) by mouth 4 (four) times daily -  with meals and at bedtime., Disp: 28 tablet, Rfl: 0   cefdinir  (OMNICEF ) 300 MG capsule, Take 1 capsule (300 mg total) by mouth 2 (two) times daily. 1 po BID, Disp: 20 capsule, Rfl: 0   fluticasone  (FLONASE ) 50 MCG/ACT nasal spray, Place 1 spray into both nostrils 2 (two) times daily as needed for allergies or rhinitis., Disp: 16 g, Rfl: 1   omeprazole  (PRILOSEC) 40 MG capsule, Take 1 capsule (40 mg total) by mouth daily., Disp: 30 capsule, Rfl: 3  Observations/Objective: Patient is well-developed, well-nourished in no acute distress.  Resting comfortably at home.  Head is normocephalic, atraumatic.  No labored breathing. Speech is clear and coherent with logical content.  Patient is alert and oriented at baseline.    Assessment and Plan: 1. Flu-like symptoms (Primary) - ondansetron  (ZOFRAN -ODT) 4 MG disintegrating tablet; Take 1-2 tablets (4-8 mg total) by mouth every 8 (eight) hours as needed.  Dispense: 20 tablet; Refill: 0 - sucralfate  (CARAFATE ) 1 g tablet; Take 1 tablet (1 g total) by mouth 4 (four) times daily -  with meals and at bedtime.  Dispense: 28 tablet; Refill: 0  - Symptoms are flu-like with body aches, headache, cold sweats, and GI symptoms - Continue symptomatic management as needed - Add Zofran  and Sucralfate  for nausea and abdominal pain - Can continue Omeprazole  - Tylenol  and Ibuprofen  alternated every 4 hours as needed for feverish symptoms and/or body aches - Push fluids - Rest - Seek in person evaluation if symptoms worsen or fail to improve  Follow Up Instructions: I discussed the assessment and treatment plan with the patient. The patient was provided an opportunity to ask questions and all were answered. The patient agreed with the plan and demonstrated an understanding of the instructions.  A copy of instructions were sent to the patient via  MyChart unless otherwise noted below.    The patient was advised to call back or seek an in-person evaluation if the symptoms worsen or if the condition fails to improve as anticipated.    Delon CHRISTELLA Dickinson, PA-C

## 2023-09-05 ENCOUNTER — Ambulatory Visit (INDEPENDENT_AMBULATORY_CARE_PROVIDER_SITE_OTHER): Payer: Medicaid Other | Admitting: Family Medicine

## 2023-09-05 ENCOUNTER — Encounter: Payer: Self-pay | Admitting: Family Medicine

## 2023-09-05 VITALS — BP 126/66 | HR 82 | Temp 98.2°F | Ht 73.0 in | Wt 165.0 lb

## 2023-09-05 DIAGNOSIS — R590 Localized enlarged lymph nodes: Secondary | ICD-10-CM

## 2023-09-05 DIAGNOSIS — F321 Major depressive disorder, single episode, moderate: Secondary | ICD-10-CM | POA: Diagnosis not present

## 2023-09-05 DIAGNOSIS — F419 Anxiety disorder, unspecified: Secondary | ICD-10-CM | POA: Diagnosis not present

## 2023-09-05 NOTE — Progress Notes (Signed)
Acute Office Visit  Subjective:     Patient ID: Troy Collins, male    DOB: September 22, 2000, 22 y.o.   MRN: [id number]  Chief Complaint  Patient presents with   Mass    HPI Patient is in today for a swollen lymph node right his right ear. Reports that this has been swollen for years. But become tender last week. Mild improvement in symptoms. Reports tenderness and discomfort to the area. No cough, congestion, ear pain, fever, chills, or sore throat. No drainage from ear.    He would also like a referral for counseling. He has looked into better help online but the cost was too high. He has not had counseling before.      09/05/2023    4:00 PM 06/25/2023    3:45 PM 02/14/2023   10:07 AM  Depression screen PHQ 2/9  Decreased Interest 2 0 0  Down, Depressed, Hopeless 3 0 0  PHQ - 2 Score 5 0 0  Altered sleeping 0 0 0  Tired, decreased energy 2 0 0  Change in appetite 3 0 0  Feeling bad or failure about yourself  1 0 0  Trouble concentrating 2 0 0  Moving slowly or fidgety/restless 2 0 0  Suicidal thoughts 0 0 0  PHQ-9 Score 15 0 0  Difficult doing work/chores Very difficult Not difficult at all Not difficult at all      09/05/2023    4:00 PM 06/25/2023    3:45 PM 02/14/2023   10:08 AM 11/30/2022    2:32 PM  GAD 7 : Generalized Anxiety Score  Nervous, Anxious, on Edge 3 0 0 0  Control/stop worrying 3 0 0 2  Worry too much - different things 3 0 0 2  Trouble relaxing 3 0 0 1  Restless 1 0 0 1  Easily annoyed or irritable 2 0 0 2  Afraid - awful might happen 1 0 0 0  Total GAD 7 Score 16 0 0 8  Anxiety Difficulty Very difficult Not difficult at all Not difficult at all Somewhat difficult     ROS As per HPI.      Objective:    BP 126/66   Pulse 82   Temp 98.2 F (36.8 C) (Temporal)   Ht 6\' 1"  (1.854 m)   Wt 165 lb (74.8 kg)   SpO2 98%   BMI 21.77 kg/m    Physical Exam Vitals and nursing note reviewed.  Constitutional:      General: He is not in acute  distress.    Appearance: He is not ill-appearing, toxic-appearing or diaphoretic.  HENT:     Right Ear: Tympanic membrane, ear canal and external ear normal.     Left Ear: Tympanic membrane, ear canal and external ear normal.     Ears:     Comments: Palpable mobile right periauricular lymph node with tenderness. No erythema or warmth.     Nose: Nose normal.     Mouth/Throat:     Mouth: Mucous membranes are moist.     Pharynx: Oropharynx is clear. No oropharyngeal exudate or posterior oropharyngeal erythema.  Cardiovascular:     Heart sounds: Normal heart sounds.  Pulmonary:     Effort: Pulmonary effort is normal.  Skin:    General: Skin is warm and dry.  Neurological:     General: No focal deficit present.     Mental Status: He is alert and oriented to person, place, and time.  Psychiatric:  Attention and Perception: Attention normal.        Mood and Affect: Mood is anxious.        Speech: Speech normal.        Behavior: Behavior normal.     No results found for any visits on 09/05/23.      Assessment & Plan:   Jaye was seen today for mass.  Diagnoses and all orders for this visit:  Posterior auricular lymphadenopathy Discussed likely reactive. [place] discussed and ordered as below. No URI symptoms or ear pain. -     US Soft Tissue Head/Neck (NON-THYROID); Future  Anxiety Depression, major, single episode, moderate (HCC) Uncontrolled. Denies SI. Referral for counseling placed as requested.  -     Ambulatory referral to Psychology  Return to office for new or worsening symptoms, or if symptoms persist. Follow up with PCP   The patient indicates understanding of these issues and agrees with the plan.  Gabriel Earing, FNP

## 2023-09-17 ENCOUNTER — Encounter: Payer: Self-pay | Admitting: Nurse Practitioner

## 2023-09-17 ENCOUNTER — Ambulatory Visit: Payer: Medicaid Other | Admitting: Nurse Practitioner

## 2023-09-17 ENCOUNTER — Ambulatory Visit (HOSPITAL_BASED_OUTPATIENT_CLINIC_OR_DEPARTMENT_OTHER): Payer: Medicaid Other

## 2023-09-17 VITALS — BP 114/70 | HR 67 | Temp 98.1°F | Ht 73.0 in | Wt 173.0 lb

## 2023-09-17 DIAGNOSIS — K219 Gastro-esophageal reflux disease without esophagitis: Secondary | ICD-10-CM

## 2023-09-17 MED ORDER — OMEPRAZOLE 40 MG PO CPDR: 40.0000 mg | DELAYED_RELEASE_CAPSULE | Freq: Every day | ORAL | 3 refills | Status: DC

## 2023-09-17 NOTE — Patient Instructions (Signed)
 Helicobacter Pylori Antibodies Test Why am I having this test? This test is used to check for a type of bacteria called Helicobacter pylori (H. pylori). H. pylori can be found in the cells that line the stomach. Having high levels of H. pylori in your stomach puts you at risk for: Stomach ulcers and small bowel ulcers. Long-term (chronic) inflammation of the lining of the stomach. Ulcers in the part of the body that moves food from the mouth to the stomach (esophagus). Stomach cancer if the infection is not treated. Most people with H. pylori in their stomach have no symptoms. Your health care provider may ask you to have this test if you have symptoms of a stomach ulcer or small bowel ulcer, such as stomach pain before or after eating, heartburn, or nausea after eating. What is being tested? This test checks your blood for antibodies to the H. pylori bacteria. Antibodies are proteins made by your immune system to fight germs and infection. The test checks for antibodies that the immune system produces in response to infection with H. pylori. What kind of sample is taken?  A blood sample is required for this test. It is usually collected by inserting a needle into a blood vessel or by sticking a finger with a small needle. Tell a health care provider about: All medicines you are taking, including vitamins, herbs, eye drops, creams, and over-the-counter medicines. How are the results reported? Your test results will be reported as values that are categorized as positive, negative, or equivocal. Equivocal means that your results are neither positive nor negative. Your health care provider will compare your results to normal ranges that were established after testing a large group of people (reference ranges). Reference ranges may vary among labs and hospitals. For this test, common reference ranges for the two types of antibodies that may be tested are: IgG antibodies: Less than 0.75 units/mL. This  is negative. 1 unit/mL or greater. This is positive. 0.75-0.99 units/mL. This is equivocal. IgM antibodies: 30 units/mL or less. This is negative. 40 units/mL or greater. This is positive. 30.01-39.99 units/mL. This is equivocal. What do the results mean? Test results that are higher than normal, or positive, may indicate various health conditions, such as: Short-term or long-term irritation of the stomach lining (gastritis). Small bowel ulcer. Stomach ulcer. Stomach cancer. Talk with your health care provider about what your results mean. Questions to ask your health care provider Ask your health care provider, or the department that is doing the test: When will my results be ready? How will I get my results? What are my treatment options? What other tests do I need? What are my next steps? Summary This test is used to check for a type of bacteria called Helicobacter pylori (H. pylori). Having high levels of H. pylori in your stomach puts you at risk for ulcers in the gastrointestinal tract or stomach cancer. Most people with H. pylori in their stomach have no symptoms. Your health care provider may ask you to have this test if you have symptoms of a stomach ulcer or small bowel ulcer, such as stomach pain before or after eating, heartburn, or nausea after eating. This test checks your blood for antibodies to the H. pylori bacteria. Talk with your health care provider about what your results mean. This information is not intended to replace advice given to you by your health care provider. Make sure you discuss any questions you have with your health care provider. Document Revised: 02/09/2021  Document Reviewed: 02/09/2021 Elsevier Patient Education  2024 [employer].

## 2023-09-17 NOTE — Progress Notes (Signed)
 Subjective:    Patient ID: Troy Collins, male    DOB: [birth year], 23 y.o.   MRN: [id number]   Chief Complaint: Wants to be tested for hpylori   HPI  Patient says his wife went to ED and was told she may have hpylori and they recommended that he be checked as well. He has been having gastric reflux and belching.  Patient Active Problem List   Diagnosis Date Noted   Pain of right hand 02/14/2023   Hand swelling 02/14/2023   Cough with fever 01/12/2021   Encounter for routine child health examination without abnormal findings 04/02/2020   Infection of nail bed of toe of left foot 01/23/2020   Fungal skin infection 01/23/2020   Childhood obesity 03/30/2015         Review of Systems  Constitutional:  Negative for diaphoresis.  Eyes:  Negative for pain.  Respiratory:  Negative for shortness of breath.   Cardiovascular:  Negative for chest pain, palpitations and leg swelling.  Gastrointestinal:  Negative for abdominal pain.  Endocrine: Negative for polydipsia.  Skin:  Negative for rash.  Neurological:  Negative for dizziness, weakness and headaches.  Hematological:  Does not bruise/bleed easily.  All other systems reviewed and are negative.      Objective:   Physical Exam Vitals and nursing note reviewed.  Constitutional:      Appearance: Normal appearance. He is well-developed.  Neck:     Thyroid : No thyroid  mass or thyromegaly.     Vascular: No carotid bruit or JVD.     Trachea: Phonation normal.  Cardiovascular:     Rate and Rhythm: Normal rate and regular rhythm.  Pulmonary:     Effort: Pulmonary effort is normal. No respiratory distress.     Breath sounds: Normal breath sounds.  Abdominal:     General: Bowel sounds are normal.     Palpations: Abdomen is soft.     Tenderness: There is no abdominal tenderness.  Musculoskeletal:        General: Normal range of motion.     Cervical back: Normal range of motion and neck supple.  Lymphadenopathy:      Cervical: No cervical adenopathy.  Skin:    General: Skin is warm and dry.  Neurological:     Mental Status: He is alert and oriented to person, place, and time.  Psychiatric:        Behavior: Behavior normal.        Thought Content: Thought content normal.        Judgment: Judgment normal.    BP 114/70   Pulse 67   Temp 98.1 F (36.7 C) (Temporal)   Ht 6\' 1"  (1.854 m)   Wt 173 lb (78.5 kg)   SpO2 97%   BMI 22.82 kg/m         Assessment & Plan:   Hanford Levo in today with chief complaint of Wants to be tested for hpylori   1. Gastroesophageal reflux disease without esophagitis (Primary) Avoid spicy foods Do not eat 2 hours prior to bedtime - omeprazole  (PRILOSEC) 40 MG capsule; Take 1 capsule (40 mg total) by mouth daily.  Dispense: 30 capsule; Refill: 3 - H. pylori antigen, stool    The above assessment and management plan was discussed with the patient. The patient verbalized understanding of and has agreed to the management plan. Patient is aware to call the clinic if symptoms persist or worsen. Patient is aware when to return to the clinic  for a follow-up visit. Patient educated on when it is appropriate to go to the emergency department.   Mary-Margaret Gaylyn Keas, FNP

## 2023-09-19 IMAGING — DX DG CHEST 2V
3 series · 3 of 3 positions shown · non-contrast
Comparison: Chest x-ray 03/15/2021, CT chest 03/13/2021

CLINICAL DATA: bilateral rib pain, worse on the right side

EXAM:
CHEST - 2 VIEW

[chest pa (1 of 2)]
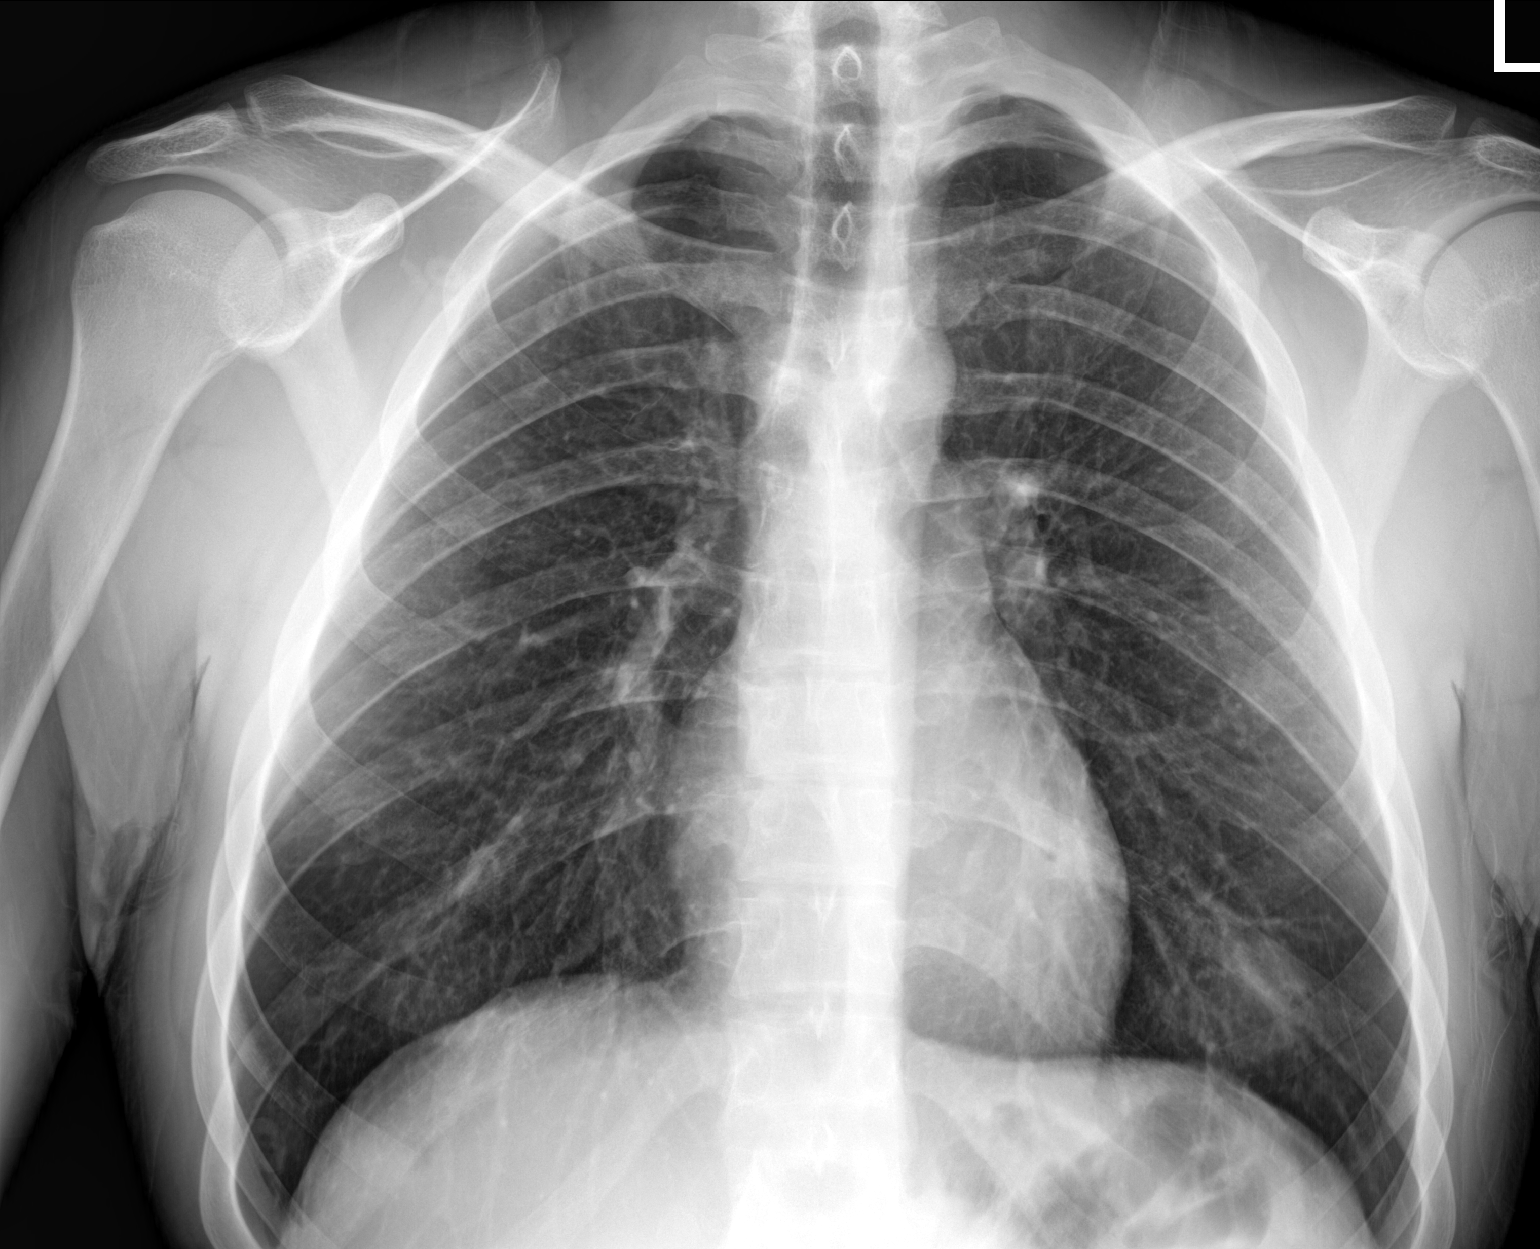

[chest lat]
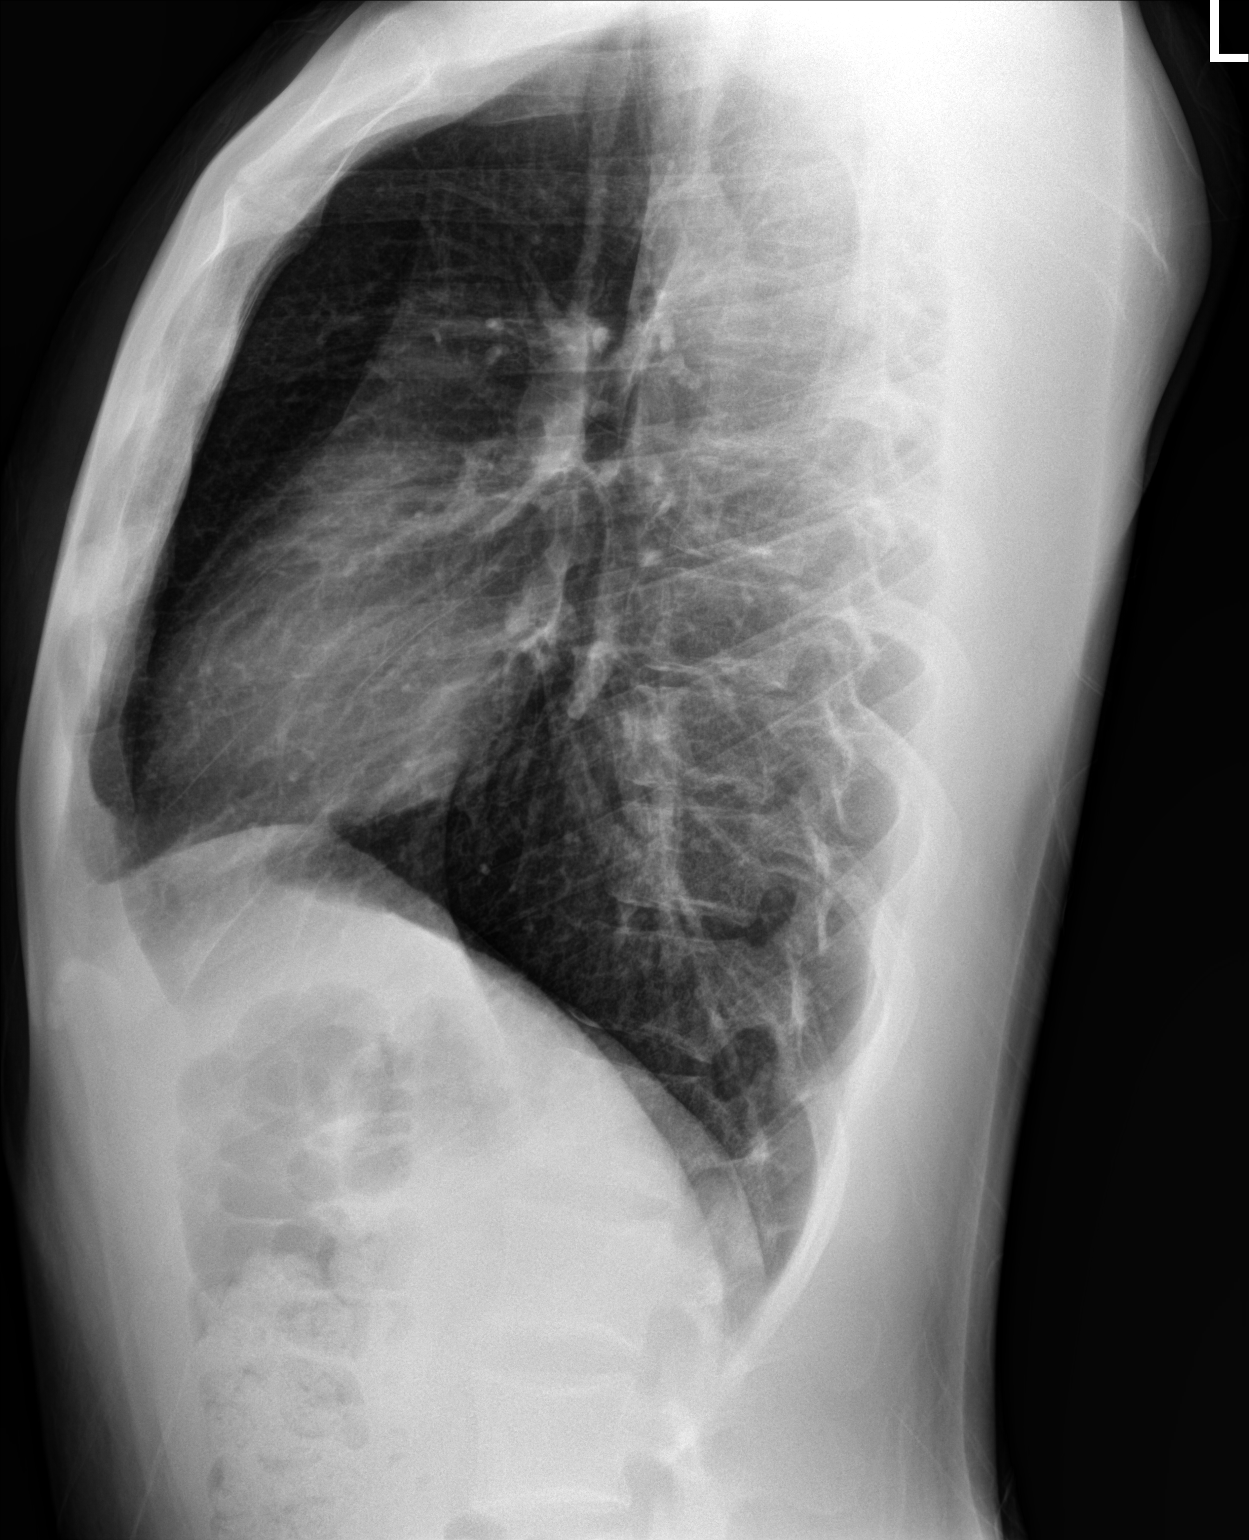

[chest pa (2 of 2)]
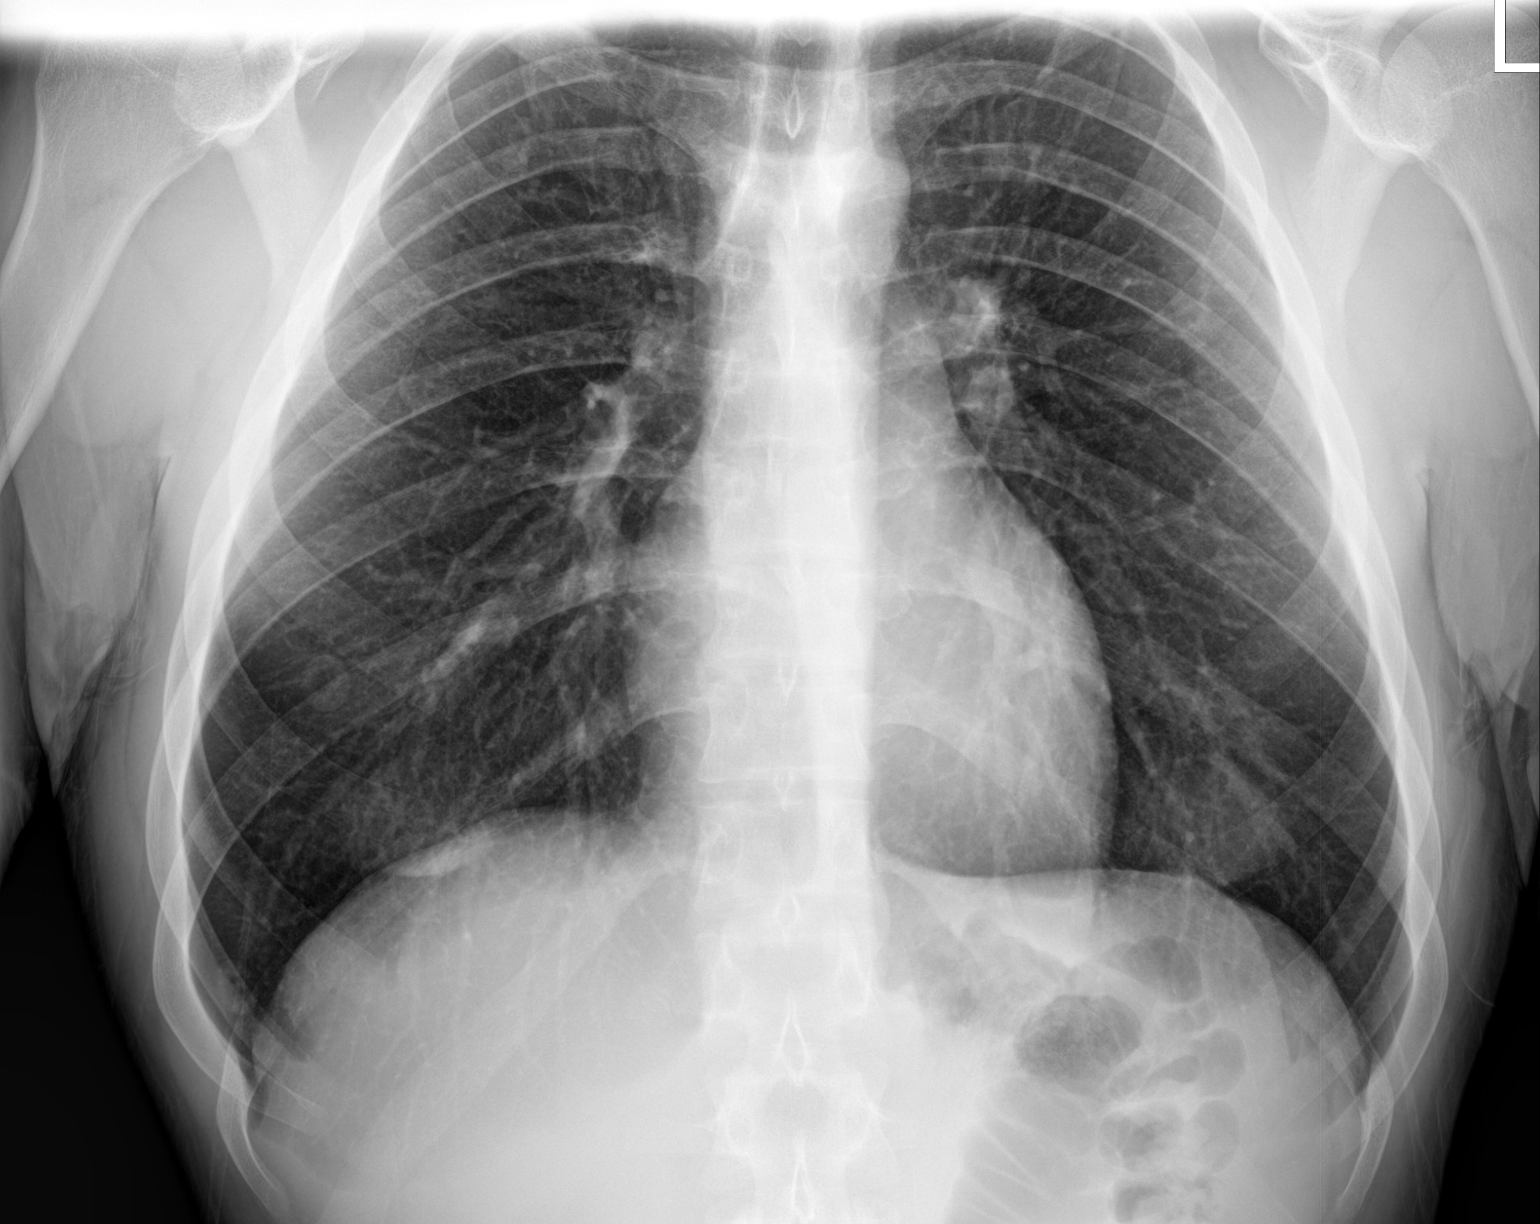

[3 of 3 positions shown; findings below may reference images not displayed]

FINDINGS: The heart and mediastinal contours are within normal limits.

Query hazy rounded airspace opacity overlying the left lower lobe.
No pulmonary edema. No pleural effusion. No pneumothorax.

No acute osseous abnormality.
IMPRESSION: Query hazy rounded airspace opacity overlying the left lower lobe.
Followup PA and lateral chest X-ray is recommended in 3-4 weeks to
ensure resolution.

## 2023-09-20 ENCOUNTER — Other Ambulatory Visit: Payer: Medicaid Other

## 2023-09-20 DIAGNOSIS — K219 Gastro-esophageal reflux disease without esophagitis: Secondary | ICD-10-CM | POA: Diagnosis not present

## 2023-09-22 LAB — H. PYLORI ANTIGEN, STOOL: H pylori Ag, Stl: NEGATIVE

## 2023-10-05 ENCOUNTER — Telehealth (HOSPITAL_COMMUNITY): Payer: Self-pay

## 2023-10-05 NOTE — Telephone Encounter (Signed)
 Lvm to confirm 10/09/23 appt by 12:00 10/08/23

## 2023-10-09 ENCOUNTER — Ambulatory Visit (HOSPITAL_COMMUNITY): Payer: Self-pay | Admitting: Clinical

## 2023-10-20 IMAGING — DX DG CHEST 2V
2 series · 2 of 2 positions shown · non-contrast
Comparison: 10/14/2021

CLINICAL DATA: Follow-up abnormal chest x-ray

EXAM:
CHEST - 2 VIEW

[chest pa]
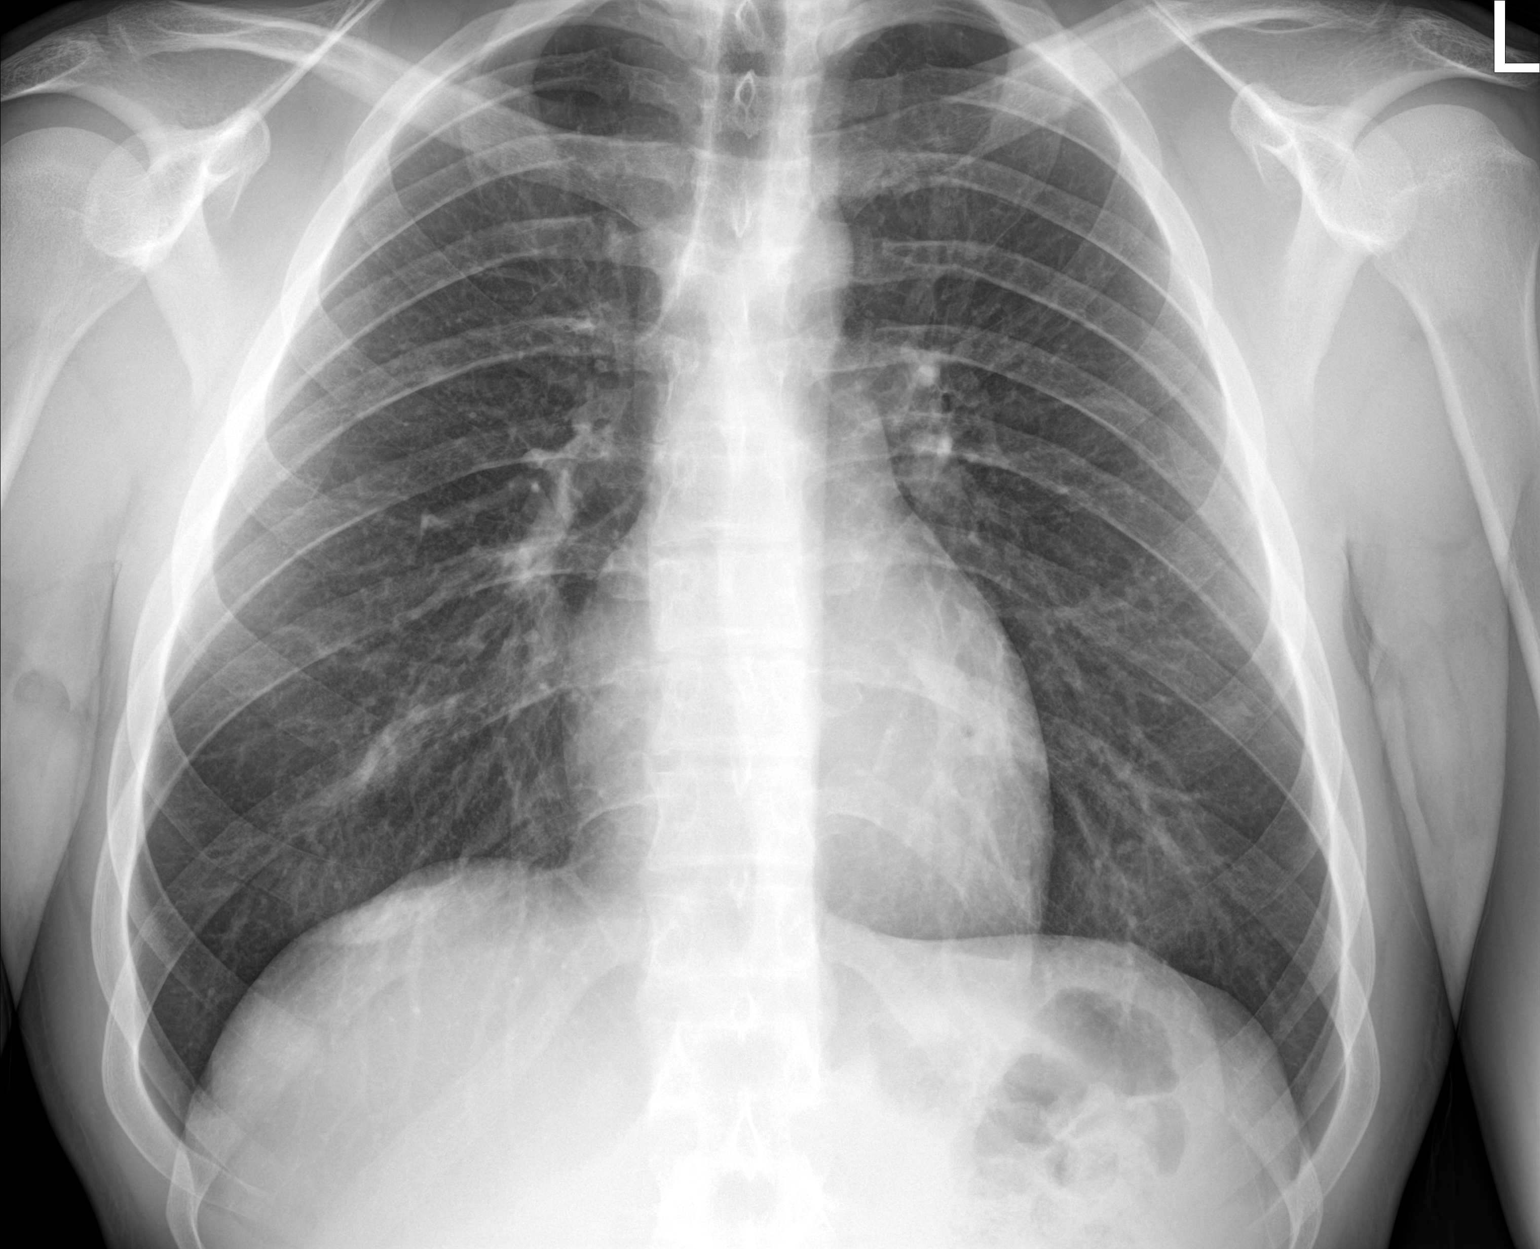

[chest lat]
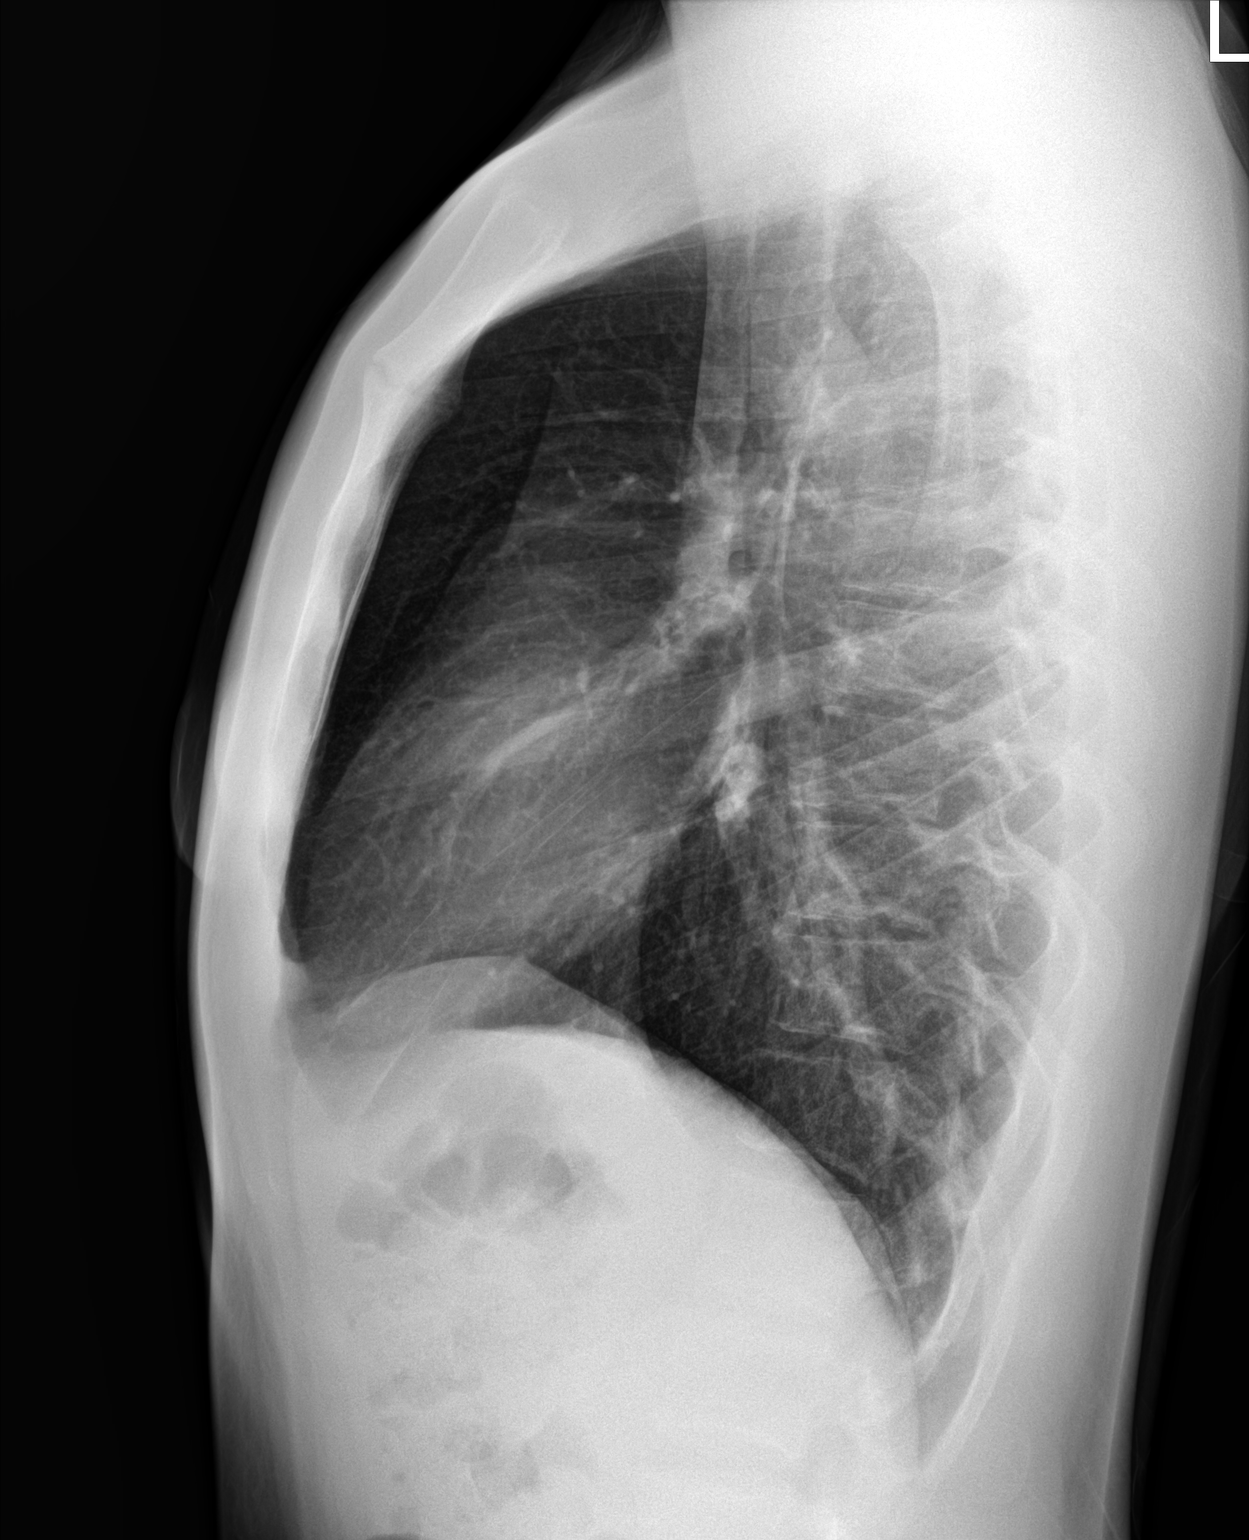

[2 of 2 positions shown; findings below may reference images not displayed]

FINDINGS: The heart size and mediastinal contours are within normal limits.
Both lungs are clear. The visualized skeletal structures are
unremarkable. Previously noted left lower lobe opacity has resolved
IMPRESSION: No active cardiopulmonary disease.

## 2023-11-16 ENCOUNTER — Ambulatory Visit (HOSPITAL_BASED_OUTPATIENT_CLINIC_OR_DEPARTMENT_OTHER): Payer: Medicaid Other

## 2024-04-30 ENCOUNTER — Ambulatory Visit: Payer: Self-pay

## 2024-04-30 NOTE — Telephone Encounter (Signed)
 FYI Only or Action Required?: FYI only for provider.  Patient was last seen in primary care on 09/17/2023 by Gladis Mustard, FNP.  Called Nurse Triage reporting Vomiting.  Symptoms began several weeks ago.  Interventions attempted: Nothing.  Symptoms are: rapidly worsening.  Triage Disposition: Go to ED Now (or PCP Triage)  Patient/caregiver understands and will follow disposition?: Yes     Copied from CRM #[id number]. Topic: Clinical - Red Word Triage >> Apr 30, 2024 11:08 AM Troy Collins wrote: Red Word that prompted transfer to Nurse Triage: vomiting, nauseous for a week. Can't eat anything. Would like virtual appt. Reason for Disposition  [1] SEVERE vomiting (e.g., 6 or more times/day) AND [2] present > 8 hours (Exception: Patient sounds well, is drinking liquids, does not sound dehydrated, and vomiting has lasted less than 24 hours.)  Answer Assessment - Initial Assessment Questions 1. VOMITING SEVERITY: How many times have you vomited in the past 24 hours?      4-5 2. ONSET: When did the vomiting begin?      Week and a half ago 3. FLUIDS: What fluids or food have you vomited up today? Have you been able to keep any fluids down?     Anything he drinks 4. ABDOMEN PAIN: Are your having any abdomen pain? If Yes : How bad is it and what does it feel like? (e.g., crampy, dull, intermittent, constant)      Yes, both sides 5. DIARRHEA: Is there any diarrhea? If Yes, ask: How many times today?      Denies  6. CONTACTS: Is there anyone else in the family with the same symptoms?      no 7. CAUSE: What do you think is causing your vomiting?     States can't eat 8. HYDRATION STATUS: Any signs of dehydration? (e.g., dry mouth [not only dry lips], too weak to stand) When did you last urinate?     Dry mouth, decrease in urination 9. OTHER SYMPTOMS: Do you have any other symptoms? (e.g., fever, headache, vertigo, vomiting blood or coffee grounds, recent head  injury)     Headache, dizziness 10. PREGNANCY: Is there any chance you are pregnant? When was your last menstrual period?       na  Protocols used: Vomiting-A-AH

## 2024-05-25 ENCOUNTER — Encounter

## 2024-05-26 ENCOUNTER — Ambulatory Visit: Admitting: Family Medicine

## 2024-05-26 ENCOUNTER — Encounter: Payer: Self-pay | Admitting: Family Medicine

## 2024-05-26 ENCOUNTER — Ambulatory Visit: Payer: Self-pay

## 2024-05-26 VITALS — BP 122/66 | HR 64 | Temp 98.0°F | Ht 73.0 in | Wt 163.0 lb

## 2024-05-26 DIAGNOSIS — M542 Cervicalgia: Secondary | ICD-10-CM

## 2024-05-26 DIAGNOSIS — G8929 Other chronic pain: Secondary | ICD-10-CM | POA: Diagnosis not present

## 2024-05-26 DIAGNOSIS — R0789 Other chest pain: Secondary | ICD-10-CM | POA: Diagnosis not present

## 2024-05-26 DIAGNOSIS — M546 Pain in thoracic spine: Secondary | ICD-10-CM | POA: Diagnosis not present

## 2024-05-26 DIAGNOSIS — H6122 Impacted cerumen, left ear: Secondary | ICD-10-CM

## 2024-05-26 MED ORDER — BETAMETHASONE SOD PHOS & ACET 6 (3-3) MG/ML IJ SUSP
6.0000 mg | Freq: Once | INTRAMUSCULAR | Status: AC
  Administered 2024-05-26: 6 mg via INTRAMUSCULAR

## 2024-05-26 MED ORDER — NABUMETONE 500 MG PO TABS: 1000.0000 mg | ORAL_TABLET | Freq: Two times a day (BID) | ORAL | 1 refills | Status: AC
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
## 2024-05-26 NOTE — Telephone Encounter (Signed)
 Appt made.

## 2024-05-29 ENCOUNTER — Encounter: Payer: Self-pay | Admitting: Family Medicine

## 2024-06-16 ENCOUNTER — Telehealth: Payer: Self-pay | Admitting: Pharmacy Technician

## 2024-06-16 ENCOUNTER — Other Ambulatory Visit (HOSPITAL_COMMUNITY): Payer: Self-pay

## 2024-06-16 NOTE — Telephone Encounter (Signed)
 Pharmacy Patient Advocate Encounter  Received notification from [employer] that Prior Authorization for Nabumetone 500MG  tablets has been APPROVED from 06/16/24 to 06/16/25. Ran test claim, Copay is $4.00. This test claim was processed through [facility]- copay amounts may vary at other pharmacies due to pharmacy/plan contracts, or as the patient moves through the different stages of their insurance plan.   PA #/Case ID/Reference #: [id number]

## 2024-06-16 NOTE — Telephone Encounter (Signed)
 Pt notified via MyChart. LS

## 2024-06-16 NOTE — Telephone Encounter (Signed)
 Pharmacy Patient Advocate Encounter   Received notification from Onbase that prior authorization for Nabumetone 500MG  tablets is required/requested.   Insurance verification completed.   The patient is insured through [employer].   Per test claim: PA required; PA submitted to above mentioned insurance via Latent Key/confirmation #/EOC [id number] Status is pending

## 2024-06-18 ENCOUNTER — Encounter: Payer: Self-pay | Admitting: Family Medicine

## 2024-06-18 ENCOUNTER — Ambulatory Visit: Admitting: Family Medicine

## 2024-06-18 VITALS — BP 118/68 | HR 105 | Temp 97.8°F | Ht 73.0 in | Wt 172.2 lb

## 2024-06-18 DIAGNOSIS — J020 Streptococcal pharyngitis: Secondary | ICD-10-CM | POA: Diagnosis not present

## 2024-06-18 DIAGNOSIS — J029 Acute pharyngitis, unspecified: Secondary | ICD-10-CM | POA: Diagnosis not present

## 2024-06-18 LAB — RAPID STREP SCREEN (MED CTR MEBANE ONLY): Strep Gp A Ag, IA W/Reflex: POSITIVE — AB

## 2024-06-18 MED ORDER — AZITHROMYCIN 250 MG PO TABS: ORAL_TABLET | ORAL | 0 refills | Status: AC

## 2024-06-18 NOTE — Progress Notes (Signed)
   Subjective: CC: Close exposure to strep throat PCP: Jolinda Norene HERO, DO YEP:Xjoojw C Davie is a 23 y.o. male presenting to clinic today for:  Patient reports that his wife was recently diagnosed with strep throat.  Just 2 to 3 days ago he started having sore throat, postnasal drainage, subjective fevers and mild cough.  The cough seems to be precipitated by the postnasal drainage.  He reports sinus pressure.  He is also had some left-sided otalgia without drainage.  No measured fevers as he did not have a thermometer at home.  No treatments thus far at home.  He is penicillin allergic   ROS: Per HPI  Allergies  Allergen Reactions   Penicillins    No past medical history on file.  Current Outpatient Medications:    nabumetone (RELAFEN) 500 MG tablet, Take 2 tablets (1,000 mg total) by mouth 2 (two) times daily. For muscle and joint pain, Disp: 360 tablet, Rfl: 1 Social History   Socioeconomic History   Marital status: Married    Spouse name: Not on file   Number of children: Not on file   Years of education: Not on file   Highest education level: Not on file  Occupational History   Not on file  Tobacco Use   Smoking status: Never   Smokeless tobacco: Never  Vaping Use   Vaping status: Never Used  Substance and Sexual Activity   Alcohol use: Yes   Drug use: Never   Sexual activity: Yes  Other Topics Concern   Not on file  Social History Narrative   Not on file   Social Drivers of Health   Financial Resource Strain: Not on file  Food Insecurity: Not on file  Transportation Needs: Not on file  Physical Activity: Not on file  Stress: Not on file  Social Connections: Not on file  Intimate Partner Violence: Not on file   Family History  Problem Relation Age of Onset   Diabetes Father    Epilepsy Mother 93       caused by photo sensitivity    Objective: Office vital signs reviewed. BP 118/68   Pulse (!) 105   Temp 97.8 F (36.6 C)   Ht 6' 1 (1.854  m)   Wt 172 lb 4 oz (78.1 kg)   SpO2 96%   BMI 22.73 kg/m   Physical Examination:  General: Awake, alert, tired but nontoxic-appearing male, No acute distress HEENT: Normal    Neck: No masses palpated.  Mild anterior cervical lymphadenopathy    Ears: Tympanic membranes intact, normal light reflex, no erythema, no bulging.  Left TM with opaque fluid behind it    Eyes: PERRLA, extraocular membranes intact, sclera white    Nose: nasal turbinates moist, clear nasal discharge    Throat: moist mucus membranes, + oropharyngeal erythema with grade 2 tonsils, no tonsillar exudate.  Airway is patent Cardio: regular rate and rhythm, S1S2 heard, no murmurs appreciated Pulm: clear to auscultation bilaterally, no wheezes, rhonchi or rales; normal work of breathing on room air    Assessment/ Plan: 23 y.o. male   Strep throat - Plan: Rapid Strep Screen ([facility] ONLY), azithromycin  (ZITHROMAX ) 250 MG tablet   Strep positive here in office.  Given penicillin allergy he was given azithromycin .  Home care instructions were reviewed and reasons for reevaluation discussed.  Work note provided.  Follow-up as needed   Norene HERO Jolinda, DO [facility] [phone]

## 2024-06-18 NOTE — Patient Instructions (Signed)
 Chloreseptic spray/ lozenges for pain.  Zpak for infection.  Strep Throat, Adult Strep throat is an infection of the throat. It is caused by germs (bacteria). Strep throat is common during the cold months of the year. It mostly affects children who are 3-22 years old. However, people of all ages can get it at any time of the year. This infection spreads from person to person through coughing, sneezing, or having close contact. What are the causes? This condition is caused by the Streptococcus pyogenes germ. What increases the risk? You care for young children. Children are more likely to get strep throat and may spread it to others. You go to crowded places. Germs can spread easily in such places. You kiss or touch someone who has strep throat. What are the signs or symptoms? Fever or chills. Redness, swelling, or pain in the tonsils or throat. Pain or trouble when swallowing. White or yellow spots on the tonsils or throat. Tender glands in the neck and under the jaw. Bad breath. Red rash all over the body. This is rare. How is this treated? Medicines that kill germs (antibiotics). Medicines that treat pain or fever. These include: Ibuprofen  or acetaminophen . Aspirin, only for people who are over the age of 60. Cough drops. Throat sprays. Follow these instructions at home: Medicines  Take over-the-counter and prescription medicines only as told by your doctor. Take your antibiotic medicine as told by your doctor. Do not stop taking the antibiotic even if you start to feel better. Eating and drinking  If you have trouble swallowing, eat soft foods until your throat feels better. Drink enough fluid to keep your pee (urine) pale yellow. To help with pain, you may have: Warm fluids, such as soup and tea. Cold fluids, such as frozen desserts or popsicles. General instructions Rinse your mouth (gargle) with a salt-water mixture 3-4 times a day or as needed. To make a salt-water  mixture, dissolve -1 tsp (3-6 g) of salt in 1 cup (237 mL) of warm water. Rest as much as you can. Stay home from work or school until you have been taking antibiotics for 24 hours. Do not smoke or use any products that contain nicotine or tobacco. If you need help quitting, ask your doctor. Keep all follow-up visits. How is this prevented?  Do not share food, drinking cups, or personal items. They can cause the germs to spread. Wash your hands well with soap and water. Make sure that all people in your house wash their hands well. Have family members tested if they have a fever or a sore throat. They may need an antibiotic if they have strep throat. Contact a doctor if: You have swelling in your neck that keeps getting bigger. You get a rash, cough, or earache. You cough up a thick fluid that is green, yellow-brown, or bloody. You have pain that does not get better with medicine. Your symptoms get worse instead of getting better. You have a fever. Get help right away if: You vomit. You have a very bad headache. Your neck hurts or feels stiff. You have chest pain or are short of breath. You have drooling, very bad throat pain, or changes in your voice. Your neck is swollen, or the skin gets red and tender. Your mouth is dry, or you are peeing less than normal. You keep feeling more tired or have trouble waking up. Your joints are red or painful. These symptoms may be an emergency. Do not wait to see if  the symptoms will go away. Get help right away. Call your local emergency services (911 in the [place] Summary Strep throat is an infection of the throat. It is caused by germs (bacteria). This infection can spread from person to person through coughing, sneezing, or having close contact. Take your medicines, including antibiotics, as told by your doctor. Do not stop taking the antibiotic even if you start to feel better. To prevent the spread of germs, wash your hands well with soap  and water. Have others do the same. Do not share food, drinking cups, or personal items. Get help right away if you have a bad headache, chest pain, shortness of breath, a stiff or painful neck, or you vomit. This information is not intended to replace advice given to you by your health care provider. Make sure you discuss any questions you have with your health care provider. Document Revised: 11/16/2020 Document Reviewed: 11/16/2020 Elsevier Patient Education  2024 [employer].

## 2024-06-19 ENCOUNTER — Ambulatory Visit: Payer: Self-pay | Admitting: Family Medicine

## 2024-06-20 ENCOUNTER — Ambulatory Visit (HOSPITAL_COMMUNITY): Admitting: Clinical

## 2024-06-20 ENCOUNTER — Encounter (HOSPITAL_COMMUNITY): Payer: Self-pay

## 2024-06-20 DIAGNOSIS — F331 Major depressive disorder, recurrent, moderate: Secondary | ICD-10-CM | POA: Diagnosis not present

## 2024-06-20 DIAGNOSIS — F419 Anxiety disorder, unspecified: Secondary | ICD-10-CM
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
## 2024-07-07 ENCOUNTER — Ambulatory Visit (HOSPITAL_COMMUNITY): Admitting: Clinical

## 2024-07-22 ENCOUNTER — Ambulatory Visit (HOSPITAL_COMMUNITY): Admitting: Clinical

## 2024-07-29 ENCOUNTER — Ambulatory Visit (HOSPITAL_COMMUNITY): Admitting: Clinical

## 2024-07-29 DIAGNOSIS — F419 Anxiety disorder, unspecified: Secondary | ICD-10-CM | POA: Diagnosis not present

## 2024-07-29 DIAGNOSIS — F331 Major depressive disorder, recurrent, moderate: Secondary | ICD-10-CM

## 2024-07-29 NOTE — Progress Notes (Signed)
 Virtual Visit via Video Note  I connected with Troy Collins on 07/29/2024 at  9:00 AM EST by a video enabled telemedicine application and verified that I am speaking with the correct person using two identifiers.  Location: Patient: home Provider: office   I discussed the limitations of evaluation and management by telemedicine and the availability of in person appointments. The patient expressed understanding and agreed to proceed.   THERAPIST PROGRESS NOTE   Session Time: 9:00 AM-9:25 PM   Participation Level: Active   Behavioral Response: CasualAlertDepressed   Type of Therapy: Individual Therapy   Treatment Goals addressed: Coping   Interventions: CBT, Motivational Interviewing, Strength-based and Supportive   Summary: [name] is a 23 y.o. male who presents with Recurrent Moderate MDD with Anxiety . The OPT therapist worked with the patient for his scheduled session. The OPT therapist utilized Motivational Interviewing to assist in creating therapeutic repore. The patient in the session was engaged and work in collaboration giving feedback about his triggers and symptoms over the past few weeks through mid December. The patient in this session spoke interactions with family recently can feeling conflicted about going to family home for holidays.The OPT therapist utilized Cognitive Behavioral Therapy through cognitive restructuring as well as worked with the patient on coping strategies to assist in management of mood and Anxiety. The patient spoke about his reluctance to add medication therapy, but will continue to use playing guitar, working on car, listenting to music, and taking a drive as part of his coping skills to manage MH symptoms.    Suicidal/Homicidal: Nowithout intent/plan   Therapist Response: The OPT therapist worked with the patient for the patients scheduled session. The patient was engaged in his session and gave feedback in relation to triggers,  symptoms, and behavior responses over the past few weeks.The patient spoke about trigger of the Christmas holiday and relationship with his family. The OPT therapist worked with the patient and evaluated the patients mood over the course of the past few weeks. The OPT therapist worked with the patient utilizing an in session Cognitive Behavioral Therapy exercise. The patient was responsive in the session and verbalized,  I just had a bad experience from taking anxiety medicine before so I am reluctant to take any medicine. The OPT therapist worked with the patient overviewing triggers, coping skills, and protective factors.The OPT therapist worked with the patient providing psychoeducation and promoting ongoing self monitoring of mood and anxiety through the week and utilizing existing supports. The OPT therapist will continue treatment work with the patient in his next scheduled session.    Plan: Next Meeting in 2/3 weeks   Diagnosis:      Axis I:  Recurrent Moderate MDD with Anxiety                           Axis II: No diagnosis   Collaboration of Care: No additional collaboration for this session.    Patient/Guardian was advised Release of Information must be obtained prior to any record release in order to collaborate their care with an outside provider. Patient/Guardian was advised if they have not already done so to contact the registration department to sign all necessary forms in order for us  to release information regarding their care.    Consent: Patient/Guardian gives verbal consent for treatment and assignment of benefits for services provided during this visit. Patient/Guardian expressed understanding and agreed to proceed.  I discussed the assessment and treatment plan with the patient. The patient was provided an opportunity to ask questions and all were answered. The patient agreed with the plan and demonstrated an understanding of the instructions.   The patient was  advised to call back or seek an in-person evaluation if the symptoms worsen or if the condition fails to improve as anticipated.   I provided 25 minutes of non-face-to-face time during this encounter.   Jerel Pepper, LCSW    07/29/2024

## 2024-08-26 ENCOUNTER — Ambulatory Visit (HOSPITAL_COMMUNITY): Admitting: Clinical

## 2024-09-12 ENCOUNTER — Ambulatory Visit (HOSPITAL_COMMUNITY): Admitting: Clinical

## 2024-09-18 ENCOUNTER — Ambulatory Visit (HOSPITAL_COMMUNITY): Admitting: Clinical
